# Patient Record
Sex: Female | Born: 1980 | Race: White | Hispanic: No | Marital: Single | Smoking: Never smoker
Health system: Southern US, Community
[De-identification: ages and names within clinical notes are randomized; demographics above are authoritative.]

## PROBLEM LIST (undated history)

## (undated) DIAGNOSIS — I10 Essential (primary) hypertension: Secondary | ICD-10-CM

## (undated) DIAGNOSIS — F259 Schizoaffective disorder, unspecified: Secondary | ICD-10-CM

## (undated) DIAGNOSIS — Z978 Presence of other specified devices: Secondary | ICD-10-CM

## (undated) DIAGNOSIS — Z933 Colostomy status: Secondary | ICD-10-CM

## (undated) DIAGNOSIS — K219 Gastro-esophageal reflux disease without esophagitis: Secondary | ICD-10-CM

## (undated) DIAGNOSIS — T148XXD Other injury of unspecified body region, subsequent encounter: Secondary | ICD-10-CM

## (undated) DIAGNOSIS — G809 Cerebral palsy, unspecified: Secondary | ICD-10-CM

## (undated) DIAGNOSIS — Z87442 Personal history of urinary calculi: Secondary | ICD-10-CM

## (undated) DIAGNOSIS — Z993 Dependence on wheelchair: Secondary | ICD-10-CM

## (undated) HISTORY — PX: COLOSTOMY: SHX63

---

## 2012-07-16 DIAGNOSIS — M419 Scoliosis, unspecified: Secondary | ICD-10-CM | POA: Insufficient documentation

## 2013-05-13 DIAGNOSIS — R339 Retention of urine, unspecified: Secondary | ICD-10-CM | POA: Insufficient documentation

## 2013-08-16 DIAGNOSIS — Z87440 Personal history of urinary (tract) infections: Secondary | ICD-10-CM | POA: Insufficient documentation

## 2013-08-16 DIAGNOSIS — N200 Calculus of kidney: Secondary | ICD-10-CM | POA: Insufficient documentation

## 2013-08-16 DIAGNOSIS — N319 Neuromuscular dysfunction of bladder, unspecified: Secondary | ICD-10-CM | POA: Insufficient documentation

## 2014-05-19 DIAGNOSIS — M5430 Sciatica, unspecified side: Secondary | ICD-10-CM | POA: Insufficient documentation

## 2014-06-24 DIAGNOSIS — G808 Other cerebral palsy: Secondary | ICD-10-CM | POA: Insufficient documentation

## 2015-01-16 DIAGNOSIS — R159 Full incontinence of feces: Secondary | ICD-10-CM | POA: Insufficient documentation

## 2015-01-16 DIAGNOSIS — K5909 Other constipation: Secondary | ICD-10-CM | POA: Insufficient documentation

## 2015-02-13 DIAGNOSIS — L89109 Pressure ulcer of unspecified part of back, unspecified stage: Secondary | ICD-10-CM | POA: Insufficient documentation

## 2015-03-20 DIAGNOSIS — T83511A Infection and inflammatory reaction due to indwelling urethral catheter, initial encounter: Secondary | ICD-10-CM | POA: Insufficient documentation

## 2015-03-20 DIAGNOSIS — N390 Urinary tract infection, site not specified: Secondary | ICD-10-CM | POA: Insufficient documentation

## 2015-04-20 DIAGNOSIS — R112 Nausea with vomiting, unspecified: Secondary | ICD-10-CM | POA: Insufficient documentation

## 2015-04-20 DIAGNOSIS — R1084 Generalized abdominal pain: Secondary | ICD-10-CM | POA: Insufficient documentation

## 2015-06-28 DIAGNOSIS — T839XXA Unspecified complication of genitourinary prosthetic device, implant and graft, initial encounter: Secondary | ICD-10-CM | POA: Insufficient documentation

## 2015-08-08 DIAGNOSIS — G8929 Other chronic pain: Secondary | ICD-10-CM | POA: Insufficient documentation

## 2016-06-09 DIAGNOSIS — T83010A Breakdown (mechanical) of cystostomy catheter, initial encounter: Secondary | ICD-10-CM | POA: Insufficient documentation

## 2016-07-26 DIAGNOSIS — Z792 Long term (current) use of antibiotics: Secondary | ICD-10-CM | POA: Insufficient documentation

## 2016-08-20 DIAGNOSIS — K5900 Constipation, unspecified: Secondary | ICD-10-CM | POA: Insufficient documentation

## 2016-09-24 DIAGNOSIS — E43 Unspecified severe protein-calorie malnutrition: Secondary | ICD-10-CM | POA: Insufficient documentation

## 2017-03-24 DIAGNOSIS — R1032 Left lower quadrant pain: Secondary | ICD-10-CM | POA: Insufficient documentation

## 2017-03-24 DIAGNOSIS — K5641 Fecal impaction: Secondary | ICD-10-CM | POA: Insufficient documentation

## 2017-10-31 DIAGNOSIS — N3001 Acute cystitis with hematuria: Secondary | ICD-10-CM | POA: Insufficient documentation

## 2018-02-13 DIAGNOSIS — N210 Calculus in bladder: Secondary | ICD-10-CM | POA: Insufficient documentation

## 2018-02-13 DIAGNOSIS — R109 Unspecified abdominal pain: Secondary | ICD-10-CM | POA: Insufficient documentation

## 2018-07-03 DIAGNOSIS — K900 Celiac disease: Secondary | ICD-10-CM | POA: Insufficient documentation

## 2018-07-03 DIAGNOSIS — K589 Irritable bowel syndrome without diarrhea: Secondary | ICD-10-CM | POA: Insufficient documentation

## 2018-07-17 DIAGNOSIS — Z0289 Encounter for other administrative examinations: Secondary | ICD-10-CM | POA: Insufficient documentation

## 2018-07-18 DIAGNOSIS — N390 Urinary tract infection, site not specified: Secondary | ICD-10-CM | POA: Insufficient documentation

## 2018-07-18 DIAGNOSIS — A419 Sepsis, unspecified organism: Secondary | ICD-10-CM | POA: Insufficient documentation

## 2019-08-27 ENCOUNTER — Encounter (HOSPITAL_BASED_OUTPATIENT_CLINIC_OR_DEPARTMENT_OTHER): Payer: Medicaid Other | Attending: Internal Medicine | Admitting: Internal Medicine

## 2019-08-27 ENCOUNTER — Other Ambulatory Visit: Payer: Self-pay

## 2019-08-27 DIAGNOSIS — L89153 Pressure ulcer of sacral region, stage 3: Secondary | ICD-10-CM | POA: Diagnosis not present

## 2019-08-27 DIAGNOSIS — G800 Spastic quadriplegic cerebral palsy: Secondary | ICD-10-CM | POA: Insufficient documentation

## 2019-08-27 DIAGNOSIS — I10 Essential (primary) hypertension: Secondary | ICD-10-CM | POA: Diagnosis not present

## 2019-08-27 DIAGNOSIS — L89313 Pressure ulcer of right buttock, stage 3: Secondary | ICD-10-CM | POA: Insufficient documentation

## 2019-08-31 DIAGNOSIS — F4310 Post-traumatic stress disorder, unspecified: Secondary | ICD-10-CM | POA: Insufficient documentation

## 2019-08-31 DIAGNOSIS — R532 Functional quadriplegia: Secondary | ICD-10-CM | POA: Insufficient documentation

## 2019-08-31 DIAGNOSIS — G809 Cerebral palsy, unspecified: Secondary | ICD-10-CM | POA: Insufficient documentation

## 2019-08-31 DIAGNOSIS — L89154 Pressure ulcer of sacral region, stage 4: Secondary | ICD-10-CM | POA: Insufficient documentation

## 2019-08-31 DIAGNOSIS — I10 Essential (primary) hypertension: Secondary | ICD-10-CM | POA: Insufficient documentation

## 2019-08-31 DIAGNOSIS — Z9359 Other cystostomy status: Secondary | ICD-10-CM | POA: Insufficient documentation

## 2019-08-31 DIAGNOSIS — F259 Schizoaffective disorder, unspecified: Secondary | ICD-10-CM | POA: Insufficient documentation

## 2019-09-01 DIAGNOSIS — D509 Iron deficiency anemia, unspecified: Secondary | ICD-10-CM | POA: Insufficient documentation

## 2019-09-10 ENCOUNTER — Encounter (HOSPITAL_BASED_OUTPATIENT_CLINIC_OR_DEPARTMENT_OTHER): Payer: Medicaid Other | Attending: Internal Medicine | Admitting: Internal Medicine

## 2019-09-10 ENCOUNTER — Other Ambulatory Visit: Payer: Self-pay

## 2019-09-10 ENCOUNTER — Encounter (HOSPITAL_BASED_OUTPATIENT_CLINIC_OR_DEPARTMENT_OTHER): Payer: Medicaid Other | Admitting: Internal Medicine

## 2019-09-10 DIAGNOSIS — F259 Schizoaffective disorder, unspecified: Secondary | ICD-10-CM | POA: Diagnosis not present

## 2019-09-10 DIAGNOSIS — F4310 Post-traumatic stress disorder, unspecified: Secondary | ICD-10-CM | POA: Insufficient documentation

## 2019-09-10 DIAGNOSIS — M069 Rheumatoid arthritis, unspecified: Secondary | ICD-10-CM | POA: Insufficient documentation

## 2019-09-10 DIAGNOSIS — L89153 Pressure ulcer of sacral region, stage 3: Secondary | ICD-10-CM | POA: Insufficient documentation

## 2019-09-10 DIAGNOSIS — Z993 Dependence on wheelchair: Secondary | ICD-10-CM | POA: Insufficient documentation

## 2019-09-10 DIAGNOSIS — R532 Functional quadriplegia: Secondary | ICD-10-CM | POA: Insufficient documentation

## 2019-09-10 DIAGNOSIS — L89313 Pressure ulcer of right buttock, stage 3: Secondary | ICD-10-CM | POA: Diagnosis present

## 2019-09-10 DIAGNOSIS — I10 Essential (primary) hypertension: Secondary | ICD-10-CM | POA: Diagnosis not present

## 2019-09-10 DIAGNOSIS — G800 Spastic quadriplegic cerebral palsy: Secondary | ICD-10-CM | POA: Insufficient documentation

## 2019-09-10 DIAGNOSIS — K5090 Crohn's disease, unspecified, without complications: Secondary | ICD-10-CM | POA: Insufficient documentation
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
## 2019-09-23 ENCOUNTER — Emergency Department (HOSPITAL_COMMUNITY)
Admission: EM | Admit: 2019-09-23 | Discharge: 2019-09-23 | Disposition: A | Discharge status: Home / Self Care | Payer: Medicaid Other | Attending: Emergency Medicine | Admitting: Emergency Medicine

## 2019-09-23 ENCOUNTER — Emergency Department (HOSPITAL_COMMUNITY): Payer: Medicaid Other

## 2019-09-23 ENCOUNTER — Encounter (HOSPITAL_COMMUNITY): Payer: Self-pay

## 2019-09-23 ENCOUNTER — Other Ambulatory Visit: Payer: Self-pay

## 2019-09-23 DIAGNOSIS — S7292XA Unspecified fracture of left femur, initial encounter for closed fracture: Secondary | ICD-10-CM | POA: Diagnosis not present

## 2019-09-23 DIAGNOSIS — Y939 Activity, unspecified: Secondary | ICD-10-CM | POA: Insufficient documentation

## 2019-09-23 DIAGNOSIS — Y999 Unspecified external cause status: Secondary | ICD-10-CM | POA: Diagnosis not present

## 2019-09-23 DIAGNOSIS — Y92199 Unspecified place in other specified residential institution as the place of occurrence of the external cause: Secondary | ICD-10-CM | POA: Diagnosis not present

## 2019-09-23 DIAGNOSIS — G8250 Quadriplegia, unspecified: Secondary | ICD-10-CM | POA: Diagnosis not present

## 2019-09-23 DIAGNOSIS — G809 Cerebral palsy, unspecified: Secondary | ICD-10-CM | POA: Insufficient documentation

## 2019-09-23 DIAGNOSIS — W2209XA Striking against other stationary object, initial encounter: Secondary | ICD-10-CM | POA: Insufficient documentation

## 2019-09-23 DIAGNOSIS — I10 Essential (primary) hypertension: Secondary | ICD-10-CM | POA: Insufficient documentation

## 2019-09-23 DIAGNOSIS — S79922A Unspecified injury of left thigh, initial encounter: Secondary | ICD-10-CM | POA: Diagnosis present

## 2019-09-23 HISTORY — DX: Cerebral palsy, unspecified: G80.9

## 2019-09-23 MED ORDER — TRAMADOL HCL 50 MG PO TABS: 50.0000 mg | ORAL_TABLET | Freq: Four times a day (QID) | ORAL | 0 refills | Status: DC | PRN

## 2019-09-23 MED ORDER — TRAMADOL HCL 50 MG PO TABS
50.0000 mg | ORAL_TABLET | Freq: Once | ORAL | Status: AC
  Administered 2019-09-23: 50 mg via ORAL
  Filled 2019-09-23: qty 1

## 2019-09-23 NOTE — Consult Note (Addendum)
I have placed a request via Secure Chat to Dr. Eulis Foster requesting photos of the wound areas of concern to be placed in the  [facility] nurse will assess patient when back on the [place].    [place], [place], Foley

## 2019-09-23 NOTE — ED Notes (Signed)
[facility] called  

## 2019-09-23 NOTE — ED Provider Notes (Signed)
[facility] DEPT Provider Note   CSN: [id number] Arrival date & time: 09/23/19  1155     History   Chief Complaint Chief Complaint  Patient presents with  . Left Leg Pain    HPI Fuller Fuller is a 38 y.o. female.     38yo female with history of CP and quadriplegia brought in by EMS from group home for left knee pain.  Patient states that she has been taking Tylenol for her knee pain but has run out of tramadol.  Patient states that she hit her knee on the counter 2 weeks ago.  No other injuries, complaints, concerns.     Past Medical History:  Diagnosis Date  . Cerebral palsy [facility])     Patient Active Problem List   Diagnosis Date Noted  . Iron deficiency anemia 09/01/2019  . Benign essential hypertension 08/31/2019  . Cerebral palsy ([place]) 08/31/2019  . Chronic suprapubic catheter ([place]) 08/31/2019  . Functional quadriplegia ([place]) 08/31/2019  . PTSD (post-traumatic stress disorder) 08/31/2019  . Sacral decubitus ulcer, stage IV ([place]) 08/31/2019  . Schizoaffective disorder ([place]) 08/31/2019    History reviewed. No pertinent surgical history.   OB History   No obstetric history on file.      Home Medications    Prior to Admission medications   Medication Sig Start Date End Date Taking? Authorizing Provider  traMADol (ULTRAM) 50 MG tablet Take 1 tablet (50 mg total) by mouth every 6 (six) hours as needed. 09/23/19   Tacy Learn, PA-C    Family History History reviewed. No pertinent family history.  Social History Social History   Tobacco Use  . Smoking status: Not on file  Substance Use Topics  . Alcohol use: Not on file  . Drug use: Not on file     Allergies   Levaquin [levofloxacin], Codeine, Docusate, Quinolones, and Sulfa antibiotics   Review of Systems Review of Systems  Constitutional: Negative for fever.  Musculoskeletal: Positive for arthralgias and joint swelling.  Skin: Negative for rash and wound.   Neurological: Negative for numbness.  Hematological: Does not bruise/bleed easily.  All other systems reviewed and are negative.    Physical Exam Updated Vital Signs BP 114/73 (BP Location: Left Arm)   Pulse 73   Temp 98.7 F (37.1 C) (Oral)   Resp 18   SpO2 100%   Physical Exam Vitals signs and nursing note reviewed.  Constitutional:      General: She is not in acute distress.    Appearance: She is well-developed. She is not diaphoretic.  HENT:     Head: Normocephalic and atraumatic.  Cardiovascular:     Pulses: Normal pulses.  Pulmonary:     Effort: Pulmonary effort is normal.  Musculoskeletal:        General: Swelling and tenderness present.     Left knee: She exhibits swelling. Tenderness found.       Legs:     Comments: Swelling to left knee compared to right with minor bruise to anterior left knee, minor abrasion without signs of secondary infection.  Skin:    General: Skin is warm and dry.  Neurological:     Mental Status: She is alert and oriented to person, place, and time.  Psychiatric:        Behavior: Behavior normal.      ED Treatments / Results  Labs (all labs ordered are listed, but only abnormal results are displayed) Labs Reviewed - No data to display  EKG None  Radiology Dg Knee Complete 4 Views Left  Result Date: 09/23/2019 CLINICAL DATA:  38 year old female with left lower extremity pain. EXAM: LEFT KNEE - COMPLETE 4+ VIEW COMPARISON:  None. FINDINGS: Evaluation is limited due to osteoporosis. There is minimally displaced and angulated fracture of the distal femoral metadiaphysis. No dislocation. There is narrowing of the medial and lateral compartments. No large effusion. The soft tissues are unremarkable. IMPRESSION: 1. Mildly displaced and angulated fracture of the distal femoral metadiaphysis. No dislocation. 2. Osteoporosis. Electronically Signed   By: Anner Crete M.D.   On: 09/23/2019 12:57    Procedures Procedures (including  critical care time)  SPLINT APPLICATION Date/Time: 8:92 PM Authorized by: Tacy Learn Consent: Verbal consent obtained. Risks and benefits: risks, benefits and alternatives were discussed Consent given by: patient Splint applied by: orthopedic technician Location details: left leg Splint type: posterior OCL Supplies used: OCL, ace Post-procedure: The splinted body part was neurovascularly unchanged following the procedure. Patient tolerance: Patient tolerated the procedure well with no immediate complications. Splinted in position of comfort.    Medications Ordered in ED Medications  traMADol (ULTRAM) tablet 50 mg (50 mg Oral Given 09/23/19 1424)     Initial Impression / Assessment and Plan / ED Course  I have reviewed the triage vital signs and the nursing notes.  Pertinent labs & imaging results that were available during my care of the patient were reviewed by me and considered in my medical decision making (see chart for details).  Clinical Course as of Sep 22 1516  Thu Sep 22, 7048  59 38 year old female presents from group home for left knee pain.  Patient has cerebral palsy and is a quadriplegic, states that she hit her knee on the counter about 2 weeks ago, has been taking Ultram for her pain at home with relief however has run out of Ultram.  On exam patient has swelling and tenderness to her left knee, small area of ecchymosis anteriorly with a small abrasion. X-ray shows a mildly displaced and angulated fracture of the distal femoral metadiaphysis.  Case was discussed with orthopedics, patient is not ambulatory, will be splinted with a posterior OCL in position of comfort with plan to follow-up with orthopedics.  Patient's tramadol was refilled.  Patient has an indwelling suprapubic catheter, states it has been in place for about a month, she has had difficulty establishing care with home health and request to have her catheter exchanged today. Case management was  consulted and is working with patient's aide to straighten out her home health situation.  Foley catheter was exchanged by myself without difficulty. Patient also request information for local dentist as she is new to the area information given.   [LM]    Clinical Course User Index [LM] Tacy Learn, PA-C      Final Clinical Impressions(s) / ED Diagnoses   Final diagnoses:  Closed fracture of left femur, unspecified fracture morphology, unspecified portion of femur, initial encounter [facility])    ED Discharge Orders         Ordered    traMADol (ULTRAM) 50 MG tablet  Every 6 hours PRN     09/23/19 1502           Tacy Learn, PA-C 09/23/19 1517    Daleen Bo, MD 09/24/19 1316

## 2019-09-23 NOTE — ED Notes (Signed)
ED Provider at bedside. 

## 2019-09-23 NOTE — ED Provider Notes (Signed)
  Face-to-face evaluation   History: She injured her left knee, 2 weeks ago and has been having persistent pain, and trouble moving around because of it.  She has functional quadriplegia from cerebral palsy.  She manages her own affairs.  She lives at her home with a caregiver that is there almost all the time.  She sometimes gets to a wheelchair but in the last several weeks, prior to the injury, she has been having trouble getting up with [facility] lift.  She was taking tramadol for pain but ran out of it, now her pain of her left knee, that was in her 2 weeks ago is intolerable.  Patient states that she manages her own affairs.  Physical exam: Debilitated appearing alert and cooperative.  Left knee is tender and swollen, as compared to right.  She cannot lift either leg off the stretcher.  No other visible signs of injury.  Patient is lucid.  Medical screening examination/treatment/procedure(s) were conducted as a shared visit with non-physician practitioner(s) and myself.  I personally evaluated the patient during the encounter    Daleen Bo, MD 09/24/19 1317

## 2019-09-23 NOTE — Discharge Instructions (Signed)
Follow-up with orthopedics, call to schedule an appointment. Take Ultram as needed as prescribed for pain. Dental information given for area dentist.

## 2019-09-23 NOTE — TOC Initial Note (Addendum)
Transition of Care [facility]) - Initial/Assessment Note    Patient Details  Name: Pamela Fuller MRN: [id number] Date of Birth: 12-Nov-1980  Transition of Care [facility]) CM/SW Contact:    Pamela Rasher, RN Phone Number: [phone] 09/23/2019, 4:25 PM  Clinical Narrative:                 Spoke to pt and caregiver, Pamela Fuller. Pt currently lives her group home. States when she was dc from [facility] a month ago they were referred for [facility] but no accepted referral. Offered choice. States she will accept agency that accept referral. States she has an appt at [facility] tomorrow. States she will doing the dressing changes but has not been shown how to do the dressing on sacral area. [place], [facility]. They are unable to accept. Contacted [facility] with new referral will give call back.   PCP [name] [phone] Has appt with Pain Management on 10/04/2019.  Pt will have CAP/DA with aide coming 30 hours per week to start next week.   Expected Discharge Plan: [place] Barriers to Discharge: No Barriers Identified   Patient Goals and CMS Choice Patient states their goals for this hospitalization and ongoing recovery are:: needs supplies to assist with wound CMS Medicare.gov Compare Post Acute Care list provided to:: Patient Represenative (must comment)(Pamela Fuller) Choice offered to / list presented to : [facility] POA / Guardian  Expected Discharge Plan and Services Expected Discharge Plan: [place]   Discharge Planning Services: CM Consult Post Acute Care Choice: Will arrangements for the past 2 months: [place]: RN          Prior Living Arrangements/Services Living arrangements for the past 2 months: Single Family Home Lives with:: Other (Comment)(Group Home) Patient language and need for interpreter reviewed:: Yes Do you feel  safe going back to the place where you live?: Yes      Need for Family Participation in Patient Care: Yes (Comment) Care giver support system in place?: Yes (comment) Current home services: DME(electric wheelchair, handicap Lucianne Lei, hospital bed. air mattress, hoyer lift) Criminal Activity/Legal Involvement Pertinent to Current Situation/Hospitalization: No - Comment as needed  Activities of Daily Living      Permission Sought/Granted Permission sought to share information with : Case Manager, PCP, Family Supports Permission granted to share information with : Yes, Verbal Permission Granted  Share Information with NAME: Pamela Fuller  Permission granted to share info w AGENCY: [place] granted to share info w Relationship: Group Home Caregiver  Permission granted to share info w Contact Information: [phone]  Emotional Assessment Appearance:: Appears stated age Attitude/Demeanor/Rapport: Engaged Affect (typically observed): Accepting Orientation: : Oriented to Self, Oriented to Place, Oriented to  Time, Oriented to Situation Alcohol / Substance Use: Never Used Psych Involvement: No (comment)  Admission diagnosis:  left leg pain Patient Active Problem List   Diagnosis Date Noted  . Iron deficiency anemia 09/01/2019  . Benign essential hypertension 08/31/2019  . Cerebral palsy ([place]) 08/31/2019  . Chronic suprapubic catheter ([place]) 08/31/2019  . 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 PTSD (post-traumatic stress disorder) 08/31/2019  . Sacral decubitus ulcer, stage IV ([place]) 08/31/2019  . Schizoaffective disorder ([place]) 08/31/2019   PCP:  Patient, No Pcp Per Pharmacy:   [id number] [address], [address] AT [facility] [address] [place] [address] Phone: [phone] Fax: [phone]     Social Determinants of Health ([place]) Interventions    Readmission Risk Interventions No flowsheet  data found.

## 2019-09-23 NOTE — ED Notes (Signed)
PTAR bedside

## 2019-09-23 NOTE — ED Triage Notes (Signed)
Pt BIB EMS from home. Pt c/o left leg pain and left hip pain. Pt takes home pain medications that she c/o running out of. Pt hit left leg on counter 2 weeks ago from her wheelchair, pt wheelchair bound. Pt has permanent suprapubic catheter.   120/80 80 HR 96% RA 98.9 temp

## 2019-09-24 ENCOUNTER — Ambulatory Visit (HOSPITAL_BASED_OUTPATIENT_CLINIC_OR_DEPARTMENT_OTHER): Payer: Medicaid Other | Admitting: Internal Medicine

## 2019-10-03 ENCOUNTER — Encounter (HOSPITAL_COMMUNITY): Payer: Self-pay

## 2019-10-03 ENCOUNTER — Emergency Department (HOSPITAL_COMMUNITY)
Admission: EM | Admit: 2019-10-03 | Discharge: 2019-10-04 | Disposition: A | Discharge status: Home / Self Care | Payer: Medicaid Other | Attending: Emergency Medicine | Admitting: Emergency Medicine

## 2019-10-03 ENCOUNTER — Other Ambulatory Visit: Payer: Self-pay

## 2019-10-03 ENCOUNTER — Emergency Department (HOSPITAL_COMMUNITY): Payer: Medicaid Other

## 2019-10-03 DIAGNOSIS — T83010A Breakdown (mechanical) of cystostomy catheter, initial encounter: Secondary | ICD-10-CM

## 2019-10-03 DIAGNOSIS — Z436 Encounter for attention to other artificial openings of urinary tract: Secondary | ICD-10-CM | POA: Diagnosis present

## 2019-10-03 DIAGNOSIS — G800 Spastic quadriplegic cerebral palsy: Secondary | ICD-10-CM | POA: Insufficient documentation

## 2019-10-03 DIAGNOSIS — R1084 Generalized abdominal pain: Secondary | ICD-10-CM | POA: Insufficient documentation

## 2019-10-03 LAB — COMPREHENSIVE METABOLIC PANEL
ALT: 11 U/L (ref 0–44)
AST: 10 U/L — ABNORMAL LOW (ref 15–41)
Albumin: 3.8 g/dL (ref 3.5–5.0)
Alkaline Phosphatase: 132 U/L — ABNORMAL HIGH (ref 38–126)
Anion gap: 9 (ref 5–15)
BUN: 20 mg/dL (ref 6–20)
CO2: 22 mmol/L (ref 22–32)
Calcium: 8.6 mg/dL — ABNORMAL LOW (ref 8.9–10.3)
Chloride: 106 mmol/L (ref 98–111)
Creatinine, Ser: <0.3 mg/dL — ABNORMAL LOW (ref 0.44–1.00)
Glucose, Bld: 94 mg/dL (ref 70–99)
Potassium: 3.6 mmol/L (ref 3.5–5.1)
Sodium: 137 mmol/L (ref 135–145)
Total Bilirubin: 0.6 mg/dL (ref 0.3–1.2)
Total Protein: 8.1 g/dL (ref 6.5–8.1)

## 2019-10-03 LAB — CBC WITH DIFFERENTIAL/PLATELET
Abs Immature Granulocytes: 0.04 10*3/uL (ref 0.00–0.07)
Basophils Absolute: 0.1 10*3/uL (ref 0.0–0.1)
Basophils Relative: 1 %
Eosinophils Absolute: 0.5 10*3/uL (ref 0.0–0.5)
Eosinophils Relative: 4 %
HCT: 39.5 % (ref 36.0–46.0)
Hemoglobin: 11.9 g/dL — ABNORMAL LOW (ref 12.0–15.0)
Immature Granulocytes: 0 %
Lymphocytes Relative: 19 %
Lymphs Abs: 2 10*3/uL (ref 0.7–4.0)
MCH: 26.1 pg (ref 26.0–34.0)
MCHC: 30.1 g/dL (ref 30.0–36.0)
MCV: 86.6 fL (ref 80.0–100.0)
Monocytes Absolute: 0.6 10*3/uL (ref 0.1–1.0)
Monocytes Relative: 6 %
Neutro Abs: 7.7 10*3/uL (ref 1.7–7.7)
Neutrophils Relative %: 70 %
Platelets: 307 10*3/uL (ref 150–400)
RBC: 4.56 MIL/uL (ref 3.87–5.11)
RDW: 24.9 % — ABNORMAL HIGH (ref 11.5–15.5)
WBC: 10.9 10*3/uL — ABNORMAL HIGH (ref 4.0–10.5)
nRBC: 0 % (ref 0.0–0.2)

## 2019-10-03 LAB — LIPASE, BLOOD: Lipase: 24 U/L (ref 11–51)

## 2019-10-03 LAB — I-STAT BETA HCG BLOOD, ED (MC, WL, AP ONLY): I-stat hCG, quantitative: <5 m[IU]/mL (ref <5)

## 2019-10-03 LAB — LACTIC ACID, PLASMA: Lactic Acid, Venous: 0.8 mmol/L (ref 0.5–1.9)

## 2019-10-03 MED ORDER — ONDANSETRON HCL 4 MG/2ML IJ SOLN
4.0000 mg | Freq: Once | INTRAMUSCULAR | Status: AC
  Administered 2019-10-03: 4 mg via INTRAVENOUS
  Filled 2019-10-03: qty 2

## 2019-10-03 MED ORDER — BACLOFEN 10 MG PO TABS: 15.0000 mg | ORAL_TABLET | Freq: Three times a day (TID) | ORAL | 0 refills | Status: AC

## 2019-10-03 MED ORDER — FENTANYL CITRATE (PF) 100 MCG/2ML IJ SOLN
25.0000 ug | Freq: Once | INTRAMUSCULAR | Status: AC
  Administered 2019-10-04: 25 ug via INTRAVENOUS
  Filled 2019-10-03: qty 2

## 2019-10-03 MED ORDER — IOHEXOL 300 MG/ML  SOLN
100.0000 mL | Freq: Once | INTRAMUSCULAR | Status: AC | PRN
  Administered 2019-10-03: 100 mL via INTRAVENOUS

## 2019-10-03 MED ORDER — SODIUM CHLORIDE 0.9 % IV BOLUS
1000.0000 mL | Freq: Once | INTRAVENOUS | Status: AC
  Administered 2019-10-03: 1000 mL via INTRAVENOUS

## 2019-10-03 MED ORDER — TRAMADOL HCL 50 MG PO TABS: 50.0000 mg | ORAL_TABLET | Freq: Three times a day (TID) | ORAL | 0 refills | Status: DC | PRN

## 2019-10-03 MED ORDER — FENTANYL CITRATE (PF) 100 MCG/2ML IJ SOLN
25.0000 ug | Freq: Once | INTRAMUSCULAR | Status: AC
  Administered 2019-10-03: 25 ug via INTRAVENOUS
  Filled 2019-10-03: qty 2

## 2019-10-03 NOTE — ED Provider Notes (Signed)
[facility] DEPT Provider Note   CSN: [id number] Arrival date & time: 10/03/19  6295    History   Chief Complaint Chief Complaint  Patient presents with  . Urinary Catheter Leak  . Abdominal Pain    lower abdominal pain    HPI Pamela Fuller is a 38 y.o. female with past medical history significant for cerebral palsy, chronic catheter, its affective disorder, sacral decubitus ulcer, anemia who presents for evaluation of abdominal pain.  Patient states she has had abdominal pain x3 days.  Pain located diffusely to her abdomen.  Patient states she is also had difficulty with leaking around her catheter site.  With her current pain a 9/10.  She denies fever, nausea, vomiting, chest pain, shortness of breath.  She admits to normal stools melena or bright red blood.  Denies additional aggravating or alleviating factors.  She has had some chills.  She is not taking anything for pain.  Denies any vaginal bleeding, vaginal discharge or pelvic pain.  She is typically wheelchair-bound however did have a femur fracture approximately 4 weeks ago and has been bedbound since.  No prior history of PE or DVT.  History obtained from patient and past medical records.  No interpreter is used.  Patient admitted to [facility] from 04/25/2019 through 08/24/2019.  Is felt to be septic from UTI.  According to records she has history of Crohn's disease, RA.     HPI  Past Medical History:  Diagnosis Date  . Cerebral palsy [facility])     Patient Active Problem List   Diagnosis Date Noted  . Iron deficiency anemia 09/01/2019  . Benign essential hypertension 08/31/2019  . Cerebral palsy ([place]) 08/31/2019  . Chronic suprapubic catheter ([place]) 08/31/2019  . 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 PTSD (post-traumatic stress disorder) 08/31/2019  . Sacral decubitus ulcer, stage IV ([place]) 08/31/2019  . Schizoaffective disorder ([place]) 08/31/2019    No past surgical history on  file.   OB History   No obstetric history on file.      Home Medications    Prior to Admission medications   Medication Sig Start Date End Date Taking? Authorizing Provider  traMADol (ULTRAM) 50 MG tablet Take 1 tablet (50 mg total) by mouth every 6 (six) hours as needed. 09/23/19   Tacy Learn, PA-C    Family History No family history on file.  Social History Social History   Tobacco Use  . Smoking status: Not on file  Substance Use Topics  . Alcohol use: Not on file  . Drug use: Not on file     Allergies   Levaquin [levofloxacin], Codeine, Docusate, Quinolones, and Sulfa antibiotics   Review of Systems Review of Systems  Constitutional: Positive for chills. Negative for activity change, appetite change, diaphoresis, fatigue and fever.  HENT: Negative.   Respiratory: Negative.   Cardiovascular: Negative.   Gastrointestinal: Positive for abdominal pain. Negative for nausea and vomiting.  Genitourinary: Negative.        Leakage around urinary catheter.  Musculoskeletal: Negative.   Skin: Negative.   Neurological: Negative.   All other systems reviewed and are negative.    Physical Exam Updated Vital Signs BP 118/77 (BP Location: Right Arm)   Pulse 73   Temp 98.2 F (36.8 C) (Oral)   Resp 17   SpO2 98% Comment: Simultaneous filing. User may not have seen previous data.  Physical Exam Vitals signs and nursing note reviewed.  Constitutional:      General:  She is not in acute distress.    Appearance: She is well-developed. She is not toxic-appearing.     Comments: Patient in hallway bed unable to undress patient for full assessment.  HENT:     Head: Atraumatic.  Eyes:     Pupils: Pupils are equal, round, and reactive to light.  Neck:     Musculoskeletal: Normal range of motion.  Cardiovascular:     Rate and Rhythm: Normal rate.     Pulses: Normal pulses.          Dorsalis pedis pulses are 2+ on the right side and 2+ on the left side.        Posterior tibial pulses are 2+ on the right side and 2+ on the left side.     Heart sounds: Normal heart sounds.  Pulmonary:     Effort: Pulmonary effort is normal. No respiratory distress.     Breath sounds: Normal breath sounds and air entry.     Comments: Clear to auscultation bilateral without wheeze, rhonchi or rales.  Speaks in full sentences without difficulty. Abdominal:     General: There is no distension.     Palpations: Abdomen is soft.     Tenderness: There is generalized abdominal tenderness. There is no right CVA tenderness, left CVA tenderness, guarding or rebound. Negative signs include Murphy's sign and McBurney's sign.     Hernia: No hernia is present.     Comments: Soft, generalized tenderness palpation.  Diverting ostomy to lower abdomen brown stool in bag. Indwelling suprapubic cath draining cloudy urine.  There is some purulent drainage around the suprapubic catheter site however no fluctuance, induration or erythema..  Musculoskeletal: Normal range of motion.     Comments: Extremities mildly contracted however able to move toes.  Brace to left femur intact.  Stage IV pressure ulcer to sacral area.  No drainage.  Skin:    General: Skin is warm and dry.     Comments: Unable to assess for skin lesions, sacral ulcer due to patient's location.  Nursing aware patient needs to be moved to room for a full exam.  Neurological:     Mental Status: She is alert.    ED Treatments / Results  Labs (all labs ordered are listed, but only abnormal results are displayed) Labs Reviewed  URINE CULTURE  CULTURE, BLOOD (ROUTINE X 2)  CULTURE, BLOOD (ROUTINE X 2)  CBC WITH DIFFERENTIAL/PLATELET  COMPREHENSIVE METABOLIC PANEL  LIPASE, BLOOD  URINALYSIS, ROUTINE W REFLEX MICROSCOPIC  LACTIC ACID, PLASMA  LACTIC ACID, PLASMA  I-STAT BETA HCG BLOOD, ED ([facility], [facility], [facility] ONLY)    EKG None  Radiology No results found.  Procedures Procedures (including critical care time)  Medications  Ordered in ED Medications  sodium chloride 0.9 % bolus 1,000 mL (1,000 mLs Intravenous New Bag/Given 10/03/19 2107)  ondansetron (ZOFRAN) injection 4 mg (4 mg Intravenous Given 10/03/19 2107)  fentaNYL (SUBLIMAZE) injection 25 mcg (25 mcg Intravenous Given 10/03/19 2107)   Initial Impression / Assessment and Plan / ED Course  I have reviewed the triage vital signs and the nursing notes.  Pertinent labs & imaging results that were available during my care of the patient were reviewed by me and considered in my medical decision making (see chart for details).  38 year old female presents for evaluation of generalized abdominal pain, nonflushing indwelling catheter as well as chills.  She is afebrile here nonseptic appearing.  Already p.o. intake at home.  She has generalized tenderness palpation.  Unfortunately I am not able to do a full skin exam of patient initial evaluation or assess her Foley catheter due to patient's location in hallway bed.  Nursing was notified that patient needed in room for further assessment.  Heart and lungs clear.  I have placed labs.  She will likely need CT scan abdomen as well as bladder scan and Foley replacement.   On exam patient has some purulent drainage to her suprapubic catheter.  No surrounding erythema, warmth, joints, induration.  She also has a stage IV pressure ulcer to her sacral region with some surrounding erythema.  No drainage.  Care transferred to [facility] [place] who will follow up on CT, Urinalysis, Labs. Disposition per Candice Camp.      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

## 2019-10-03 NOTE — ED Provider Notes (Signed)
Care assumed at shift change from [facility], PA-C, pending labs and CT scan. See her note for full HPI and workup. Briefly, pt presenting with hx spastic quad and cerebral palsy. Chronic suprapubic cath and ostomy bag. Presenting today with gen abd pain x3 days.  Also noted to have drainage around cath site, no surrounding redness, induration or tenderness. Urine is cloudy.  Of note, patient has chronicstage 4 sacral ulcer, pain is unchanged from baseline and no concerns regarding this today.  She also had recent left femur fracture on 09/23/2019 fractured left femur, which is splinted, as patient is nonambulatory.  Plan to follow-up CT scan and lab work.  CT scan ordered with history of IBS in mind, however abdominal exam is without peritoneal signs. Physical Exam  BP 118/77 (BP Location: Right Arm)   Pulse 73   Temp 98.2 F (36.8 C) (Oral)   Resp 17   SpO2 98% Comment: Simultaneous filing. User may not have seen previous data.  Physical Exam Vitals signs and nursing note reviewed.  Constitutional:      General: She is not in acute distress.    Appearance: She is well-developed.  HENT:     Head: Normocephalic and atraumatic.  Eyes:     Conjunctiva/sclera: Conjunctivae normal.  Cardiovascular:     Rate and Rhythm: Normal rate.  Pulmonary:     Effort: Pulmonary effort is normal.  Abdominal:     General: Bowel sounds are normal.     Palpations: Abdomen is soft.     Tenderness: There is no abdominal tenderness.     Comments: Suprapubic catheter in place, small amount of drainage noted at the site.  No surrounding inflammation. Ostomy with brown stool and moderate sized prolapse.  Skin:    General: Skin is warm.  Neurological:     Mental Status: She is alert.  Psychiatric:        Behavior: Behavior normal.     ED Course/Procedures     Procedures Results for orders placed or performed during the hospital encounter of 10/03/19  CBC with Differential  Result Value Ref Range   WBC  10.9 (H) 4.0 - 10.5 K/uL   RBC 4.56 3.87 - 5.11 MIL/uL   Hemoglobin 11.9 (L) 12.0 - 15.0 g/dL   HCT 39.5 36.0 - 46.0 %   MCV 86.6 80.0 - 100.0 fL   MCH 26.1 26.0 - 34.0 pg   MCHC 30.1 30.0 - 36.0 g/dL   RDW 24.9 (H) 11.5 - 15.5 %   Platelets 307 150 - 400 K/uL   nRBC 0.0 0.0 - 0.2 %   Neutrophils Relative % 70 %   Neutro Abs 7.7 1.7 - 7.7 K/uL   Lymphocytes Relative 19 %   Lymphs Abs 2.0 0.7 - 4.0 K/uL   Monocytes Relative 6 %   Monocytes Absolute 0.6 0.1 - 1.0 K/uL   Eosinophils Relative 4 %   Eosinophils Absolute 0.5 0.0 - 0.5 K/uL   Basophils Relative 1 %   Basophils Absolute 0.1 0.0 - 0.1 K/uL   Smear Review MORPHOLOGY UNREMARKABLE    Immature Granulocytes 0 %   Abs Immature Granulocytes 0.04 0.00 - 0.07 K/uL  Comprehensive metabolic panel  Result Value Ref Range   Sodium 137 135 - 145 mmol/L   Potassium 3.6 3.5 - 5.1 mmol/L   Chloride 106 98 - 111 mmol/L   CO2 22 22 - 32 mmol/L   Glucose, Bld 94 70 - 99 mg/dL   BUN 20 6 -  20 mg/dL   Creatinine, Ser <0.30 (L) 0.44 - 1.00 mg/dL   Calcium 8.6 (L) 8.9 - 10.3 mg/dL   Total Protein 8.1 6.5 - 8.1 g/dL   Albumin 3.8 3.5 - 5.0 g/dL   AST 10 (L) 15 - 41 U/L   ALT 11 0 - 44 U/L   Alkaline Phosphatase 132 (H) 38 - 126 U/L   Total Bilirubin 0.6 0.3 - 1.2 mg/dL   GFR calc non Af Amer NOT CALCULATED >60 mL/min   GFR calc Af Amer NOT CALCULATED >60 mL/min   Anion gap 9 5 - 15  Lipase, blood  Result Value Ref Range   Lipase 24 11 - 51 U/L  Lactic acid, plasma  Result Value Ref Range   Lactic Acid, Venous 0.8 0.5 - 1.9 mmol/L  I-Stat Beta hCG blood, ED ([facility], [facility], [facility] only)  Result Value Ref Range   I-stat hCG, quantitative <5.0 <5 mIU/mL   Comment 3           Ct Abdomen Pelvis W Contrast  Result Date: 10/03/2019 CLINICAL DATA:  Acute abdominal pain EXAM: CT ABDOMEN AND PELVIS WITH CONTRAST TECHNIQUE: Multidetector CT imaging of the abdomen and pelvis was performed using the standard protocol following bolus administration of  intravenous contrast. CONTRAST:  161m OMNIPAQUE IOHEXOL 300 MG/ML  SOLN COMPARISON:  August 31, 2019 FINDINGS: Lower chest: The visualized heart size within normal limits. No pericardial fluid/thickening. No hiatal hernia. The visualized portions of the lungs are clear. Hepatobiliary: The liver is normal in density without focal abnormality.The main portal vein is patent. No evidence of calcified gallstones, gallbladder wall thickening or biliary dilatation. Pancreas: Unremarkable. No pancreatic ductal dilatation or surrounding inflammatory changes. Spleen: Normal in size without focal abnormality. Adrenals/Urinary Tract: Both adrenal glands appear normal. The kidneys and collecting system appear normal without evidence of urinary tract calculus or hydronephrosis. A suprapubic catheter is again noted. There is diffuse bladder wall thickening and posterior bladder calculi again seen. Stomach/Bowel: The stomach, small bowel, are normal in appearance. Again noted is a left lower quadrant colostomy site. No areas of significant fat stranding changes or bowel wall thickening. There is under distention of the sigmoid rectal junction as on the prior exam. Scattered colonic diverticula are noted. Vascular/Lymphatic: There are no enlarged mesenteric, retroperitoneal, or pelvic lymph nodes. No significant vascular findings are present. Reproductive: Lobular heterogeneous intrauterine fibroids are seen. A small amount of fluid is seen within the endometrial canal. Other: No hernia is noted. Musculoskeletal: Unchanged sacral decubitus ulcer with findings of cortical destruction of probable chronic osteomyelitis involving sacral coccygeal element. Posterior spinal fixation hardware seen throughout the thoracolumbar spine. IMPRESSION: 1. Diffuse bladder wall thickening, likely due to chronic changes. No significant surrounding fat stranding changes. 2. Bladder calculi 3. Left lower quadrant colostomy without evidence of bowel  obstruction. 4. Sacral decubitus ulcer with findings of chronic osteomyelitis involving the sacrococcygeal element Electronically Signed   By: BPrudencio PairM.D.   On: 10/03/2019 23:48    MDM  Care assumed at shift change pending labs and CT scan.  Labs are very reassuring and vital signs have remained stable.  CT scan without acute pathology.  UA is pending at this time, to be followed by oncoming provider PA McDonald.  Patient symptoms improved with interventions, her main complaint on my evaluation is her left leg pain after recent femur fracture.  She states her pain was significantly improved with the prescribed tramadol, however she has run out of the  medications.  She also states she was unable to find the referral to the orthopedic specialist and has not been able to do so.  She states she is fairly new to the area and is still establishing her primary providers.  She is requesting a gynecology referral as she has been unsuccessful in finding a provider to accept Medicaid.  She is also requesting a refill of baclofen which she takes chronically for her muscle spasms.  Refer to women's outpatient clinic is provided in discharge paperwork as well as contact information for initial orthopedic referral per ED visit she was diagnosed with femur fracture.  Also sent in refill for tramadol and baclofen.   Plan to treat UTI if present, otherwise discharge with symptomatic management.      Ketsia Linebaugh, [place] N, PA-C 10/04/19 0012    Lucrezia Starch, MD 10/04/19 1154

## 2019-10-03 NOTE — ED Triage Notes (Signed)
Arrived by [facility] from home; live with caregiver. Patient reports leak in indwelling catheter where it connects to bag and lower abdominal pain. EMS reports patient was septic in June and hospitalized from June 2020 to October 2020

## 2019-10-03 NOTE — Discharge Instructions (Addendum)
Take 1 tablet of Augmentin by mouth 2 times daily for the next 14 days.  This has been called into your pharmacy along with tramadol and baclofen.  You can call [facility] first thing tomorrow morning to schedule a time to have your suprapubic catheter exchanged.  I have also consulted interventional radiology.  They may give you a hot call at home.  They can also do a suprapubic catheter exchange.  ORTHOPEDIC SPECIALIST to follow up on your leg: Erle Crocker, MD [place]  [id number] [phone]   Gynecology: [facility] [place] [phone]

## 2019-10-03 NOTE — ED Provider Notes (Signed)
38 year old female received at signout from [facility] pending urinalysis results.  Per her HPI:  "Pamela Fuller is a 38 y.o. female with past medical history significant for cerebral palsy, chronic catheter, its affective disorder, sacral decubitus ulcer, anemia who presents for evaluation of abdominal pain.  Patient states she has had abdominal pain x3 days.  Pain located diffusely to her abdomen.  Patient states she is also had difficulty with leaking around her catheter site.  With her current pain a 9/10.  She denies fever, nausea, vomiting, chest pain, shortness of breath.  She admits to normal stools melena or bright red blood.  Denies additional aggravating or alleviating factors.  She has had some chills.  She is not taking anything for pain.  Denies any vaginal bleeding, vaginal discharge or pelvic pain.  She is typically wheelchair-bound however did have a femur fracture approximately 4 weeks ago and has been bedbound since.  No prior history of PE or DVT.  History obtained from patient and past medical records.  No interpreter is used.  Patient admitted to [facility] from 04/25/2019 through 08/24/2019.  Is felt to be septic from UTI.  According to records she has history of Crohn's disease, RA."  Physical Exam  BP 122/84   Pulse 78   Temp 98.2 F (36.8 C) (Oral)   Resp 17   SpO2 96%   Physical Exam Vitals signs and nursing note reviewed.  Constitutional:      Appearance: She is well-developed.  HENT:     Head: Normocephalic and atraumatic.  Neck:     Musculoskeletal: Normal range of motion.  Abdominal:     Comments: Suprapubic catheter is in place.  There is leaking around the site of the catheter.  Musculoskeletal: Normal range of motion.     Comments: Ace wrap in place over the left thigh  Skin:    Comments: Stage IV sacral ulcer.  Neurological:     Mental Status: She is alert and oriented to person, place, and time.     ED Course/Procedures      Procedures  MDM   38 year old female received at signout from [facility] who was initially seen by PA Henderly pending urinalysis results.  Please see their notes for further work-up and medical decision making.  Urinalysis concerning for infection.  The patient has allergies to fluoroquinolones and sulfa antibiotics.  No previous urine cultures are available so we will discharge home with Augmentin.  The patient's primary complaint for presenting to the ER today was a leaking suprapubic catheter.  There was concern about abdominal tenderness and a CT abdomen pelvis was performed, which did not show any acute changes.  It did redemonstrate chronic osteomyelitis, but no evidence of acute changes.    Unfortunately, the patient's primary concern of her suprapubic catheter leaking was not exchanged prior to my evaluation of the patient in the middle of the night.  Discussed with the patient that urology and IR are not available for emergent suprapubic catheter placement overnight in the ER.  Her catheter is draining, but there is noted to be some leaking around the site on exam.  Will follow up with IR and refer the patient to urology for suprapubic catheter exchange.  PA Quentin Cornwall has also called in a refill of tramadol given the patient's femur fracture and has we refer the patient to orthopedics for follow-up.  She also provided her with an OB/GYN referral.   All questions answered.  ER return precautions given.  She is hemodynamically stable and in no acute distress.  Safe for discharge home with outpatient follow-up as indicated.     Joanne Gavel, PA-C 10/04/19 7921    Palumbo, April, MD 10/06/19 2353

## 2019-10-04 LAB — URINALYSIS, ROUTINE W REFLEX MICROSCOPIC
Bilirubin Urine: NEGATIVE
Glucose, UA: NEGATIVE mg/dL
Ketones, ur: NEGATIVE mg/dL
Nitrite: POSITIVE — AB
Protein, ur: >=300 mg/dL — AB
Specific Gravity, Urine: 1.018 (ref 1.005–1.030)
pH: 8 (ref 5.0–8.0)

## 2019-10-04 MED ORDER — CEFTRIAXONE SODIUM 1 G IJ SOLR
1.0000 g | Freq: Once | INTRAMUSCULAR | Status: AC
  Administered 2019-10-04: 1 g via INTRAMUSCULAR
  Filled 2019-10-04: qty 10

## 2019-10-04 MED ORDER — AMOXICILLIN-POT CLAVULANATE 875-125 MG PO TABS: 1.0000 | ORAL_TABLET | Freq: Two times a day (BID) | ORAL | 0 refills | Status: AC

## 2019-10-04 MED ORDER — TRAMADOL HCL 50 MG PO TABS
50.0000 mg | ORAL_TABLET | Freq: Once | ORAL | Status: AC
  Administered 2019-10-04: 50 mg via ORAL
  Filled 2019-10-04: qty 1

## 2019-10-04 MED ORDER — STERILE WATER FOR INJECTION IJ SOLN
INTRAMUSCULAR | Status: AC
  Filled 2019-10-04: qty 10

## 2019-10-04 NOTE — ED Notes (Signed)
Attempted bladder scan, pt. Stating it was too painful to continue scanning the bladder.

## 2019-10-05 ENCOUNTER — Encounter (HOSPITAL_BASED_OUTPATIENT_CLINIC_OR_DEPARTMENT_OTHER): Payer: Medicaid Other | Attending: Internal Medicine | Admitting: Internal Medicine

## 2019-10-05 ENCOUNTER — Other Ambulatory Visit: Payer: Self-pay

## 2019-10-05 DIAGNOSIS — F259 Schizoaffective disorder, unspecified: Secondary | ICD-10-CM | POA: Insufficient documentation

## 2019-10-05 DIAGNOSIS — L89313 Pressure ulcer of right buttock, stage 3: Secondary | ICD-10-CM | POA: Insufficient documentation

## 2019-10-05 DIAGNOSIS — Z993 Dependence on wheelchair: Secondary | ICD-10-CM | POA: Insufficient documentation

## 2019-10-05 DIAGNOSIS — F4310 Post-traumatic stress disorder, unspecified: Secondary | ICD-10-CM | POA: Insufficient documentation

## 2019-10-05 DIAGNOSIS — R532 Functional quadriplegia: Secondary | ICD-10-CM | POA: Diagnosis not present

## 2019-10-05 DIAGNOSIS — Z933 Colostomy status: Secondary | ICD-10-CM | POA: Insufficient documentation

## 2019-10-05 DIAGNOSIS — K5090 Crohn's disease, unspecified, without complications: Secondary | ICD-10-CM | POA: Diagnosis not present

## 2019-10-05 DIAGNOSIS — I10 Essential (primary) hypertension: Secondary | ICD-10-CM | POA: Diagnosis not present

## 2019-10-05 DIAGNOSIS — M069 Rheumatoid arthritis, unspecified: Secondary | ICD-10-CM | POA: Insufficient documentation

## 2019-10-05 DIAGNOSIS — G800 Spastic quadriplegic cerebral palsy: Secondary | ICD-10-CM | POA: Insufficient documentation

## 2019-10-05 DIAGNOSIS — L89153 Pressure ulcer of sacral region, stage 3: Secondary | ICD-10-CM | POA: Diagnosis not present

## 2019-10-06 LAB — URINE CULTURE: Culture: >=100000 — AB

## 2019-10-07 ENCOUNTER — Telehealth: Payer: Self-pay

## 2019-10-07 NOTE — Telephone Encounter (Signed)
Post ED Visit - Positive Culture Follow-up  Culture report reviewed by antimicrobial stewardship pharmacist: [place] Team [x]  Elenor Quinones, Pharm.D. []  Heide Guile, Pharm.D., BCPS AQ-ID []  Parks Neptune, Pharm.D., BCPS []  Alycia Rossetti, Pharm.D., BCPS []  [place], Pharm.D., BCPS, AAHIVP []  Legrand Como, Pharm.D., BCPS, AAHIVP []  Salome Arnt, PharmD, BCPS []  Johnnette Gourd, PharmD, BCPS []  Hughes Better, PharmD, BCPS []  Leeroy Cha, PharmD []  Laqueta Linden, PharmD, BCPS []  Albertina Parr, PharmD  [place] Team []  Leodis Sias, PharmD []  Lindell Spar, PharmD []  Royetta Asal, PharmD []  Graylin Shiver, Rph []  Rema Fendt) Glennon Mac, PharmD []  Arlyn Dunning, PharmD []  Netta Cedars, PharmD []  Dia Sitter, PharmD []  Leone Haven, PharmD []  Gretta Arab, PharmD []  Theodis Shove, PharmD []  Peggyann Juba, PharmD []  Reuel Boom, PharmD   Positive urine culture Treated with Amoxicillin, organism sensitive to the same and no further patient follow-up is required at this time.  Dason Mosley, Carolynn Comment 10/07/2019, 10:00 AM

## 2019-10-09 LAB — CULTURE, BLOOD (ROUTINE X 2)
Culture: NO GROWTH
Special Requests: ADEQUATE

## 2019-10-12 NOTE — Progress Notes (Signed)
LYNIX, BONINE ([id number]) Visit Report for 09/10/2019 Arrival Information Details Patient Name: Date of Service: LIBERTIE, HAUSLER 09/10/2019 12:30 PM Medical Record [id number] Patient Account Number: [id number] Date of Birth/Sex: Treating RN: 07/13/1981 (38 y.o. Orvan Falconer Primary Care Deaven Barron: Clovia Cuff Other Clinician: Referring Brookes Craine: Treating Malcom Selmer/Extender:Robson, Esperanza Richters in Treatment: 2 Visit Information History Since Last Visit All ordered tests and consults were completed: No Patient Arrived: Wheel Chair Added or deleted any medications: No Arrival Time: 13:27 Any new allergies or adverse reactions: No Accompanied By: self Had a fall or experienced change in No activities of daily living that may affect Transfer Assistance: Harrel Lemon Lift risk of falls: Patient Identification Verified: Yes Signs or symptoms of abuse/neglect since last No Secondary Verification Process Completed: Yes visito Patient Requires Transmission-Based No Hospitalized since last visit: No Precautions: Implantable device outside of the clinic excluding No Patient Has Alerts: No cellular tissue based products placed in the center since last visit: Has Dressing in Place as Prescribed: Yes Pain Present Now: No Electronic Signature(s) Signed: 10/12/2019 2:48:51 PM By: Carlene Coria RN Entered By: Carlene Coria on 09/10/2019 13:27:36 -------------------------------------------------------------------------------- Clinic Level of Care Assessment Details Patient Name: Date of Service: AZJAH, PARDO 09/10/2019 12:30 PM Medical Record [id number] Patient Account Number: [id number] Date of Birth/Sex: Treating RN: 01-26-1981 (38 y.o. Clearnce Sorrel Primary Care Reinhart Saulters: Clovia Cuff Other Clinician: Referring Maricel Swartzendruber: Treating Hasan Douse/Extender:Robson, Esperanza Richters in Treatment: 2 Clinic Level of Care Assessment Items TOOL 4 Quantity Score X - Use when only  an EandM is performed on FOLLOW-UP visit 1 0 ASSESSMENTS - Nursing Assessment / Reassessment X - Reassessment of Co-morbidities (includes updates in patient status) 1 10 X - Reassessment of Adherence to Treatment Plan 1 5 ASSESSMENTS - Wound and Skin Assessment / Reassessment []  - Simple Wound Assessment / Reassessment - one wound 0 X - Complex Wound Assessment / Reassessment - multiple wounds 2 5 []  - Dermatologic / Skin Assessment (not related to wound area) 0 ASSESSMENTS - Focused Assessment []  - Circumferential Edema Measurements - multi extremities 0 []  - Nutritional Assessment / Counseling / Intervention 0 []  - Lower Extremity Assessment (monofilament, tuning fork, pulses) 0 []  - Peripheral Arterial Disease Assessment (using hand held doppler) 0 ASSESSMENTS - Ostomy and/or Continence Assessment and Care []  - Incontinence Assessment and Management 0 []  - Ostomy Care Assessment and Management (repouching, etc.) 0 PROCESS - Coordination of Care []  - Simple Patient / Family Education for ongoing care 0 X - Complex (extensive) Patient / Family Education for ongoing care 1 20 X - Staff obtains Programmer, systems, Records, Test Results / Process Orders 1 10 X - Staff telephones HHA, Nursing Homes / Clarify orders / etc 1 10 []  - Routine Transfer to another Facility (non-emergent condition) 0 []  - Routine Hospital Admission (non-emergent condition) 0 []  - New Admissions / Biomedical engineer / Ordering NPWT, Apligraf, etc. 0 []  - Emergency Hospital Admission (emergent condition) 0 []  - Simple Discharge Coordination 0 X - Complex (extensive) Discharge Coordination 1 15 PROCESS - Special Needs []  - Pediatric / Minor Patient Management 0 []  - Isolation Patient Management 0 []  - Hearing / Language / Visual special needs 0 []  - Assessment of Community assistance (transportation, D/C planning, etc.) 0 []  - Additional assistance / Altered mentation 0 []  - Support Surface(s) Assessment (bed,  cushion, seat, etc.) 0 INTERVENTIONS - Wound Cleansing / Measurement []  - Simple Wound Cleansing - one wound 0 X - Complex Wound Cleansing - multiple wounds 2 5 X -  Wound Imaging (photographs - any number of wounds) 1 5 []  - Wound Tracing (instead of photographs) 0 []  - Simple Wound Measurement - one wound 0 X - Complex Wound Measurement - multiple wounds 2 5 INTERVENTIONS - Wound Dressings X - Small Wound Dressing one or multiple wounds 2 10 []  - Medium Wound Dressing one or multiple wounds 0 []  - Large Wound Dressing one or multiple wounds 0 X - Application of Medications - topical 1 5 []  - Application of Medications - injection 0 INTERVENTIONS - Miscellaneous []  - External ear exam 0 []  - Specimen Collection (cultures, biopsies, blood, body fluids, etc.) 0 []  - Specimen(s) / Culture(s) sent or taken to Lab for analysis 0 []  - Patient Transfer (multiple staff / Civil Service fast streamer / Similar devices) 0 []  - Simple Staple / Suture removal (25 or less) 0 []  - Complex Staple / Suture removal (26 or more) 0 []  - Hypo / Hyperglycemic Management (close monitor of Blood Glucose) 0 []  - Ankle / Brachial Index (ABI) - do not check if billed separately 0 X - Vital Signs 1 5 Has the patient been seen at the hospital within the last three years: Yes Total Score: 135 Level Of Care: New/Established - Level 4 Electronic Signature(s) Signed: 09/10/2019 6:04:56 PM By: Kela Millin Entered By: Kela Millin on 09/10/2019 14:03:41 -------------------------------------------------------------------------------- Encounter Discharge Information Details Patient Name: Date of Service: IVETT, LUEBBE 09/10/2019 12:30 PM Medical Record [id number] Patient Account Number: [id number] Date of Birth/Sex: Treating RN: [birth year] (38 y.o. Debby Bud Primary Care Mayetta Castleman: Clovia Cuff Other Clinician: Referring Candid Bovey: Treating Jeson Camacho/Extender:Robson, Esperanza Richters in Treatment:  2 Encounter Discharge Information Items Discharge Condition: Stable Ambulatory Status: Wheelchair Discharge Destination: Home Transportation: Private Auto Accompanied By: caregiver Schedule Follow-up Appointment: Yes Clinical Summary of Care: Notes Per patient provided permission to allow caregiver into room for discharge instructions. Per caregiver unsure what the signs and symptoms of infection and she noted an odor last Tuesday called EMS and [facility] admitted patient x5 days and discharged on 09/04/2019. 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 Explained to patient and caregiver would make Dr. Dellia Nims aware. MD and case manager notified. Electronic Signature(s) Signed: 09/10/2019 5:26:58 PM By: Deon Pilling Entered By: Deon Pilling on 09/10/2019 15:53:03 -------------------------------------------------------------------------------- Multi Wound Chart Details Patient Name: Date of Service: LUCILLA, PETRENKO 09/10/2019 12:30 PM Medical Record [id number] Patient Account Number: [id number] Date of Birth/Sex: Treating RN: [birth year] (38 y.o. F) Primary Care Tacia Hindley: Clovia Cuff Other Clinician: Referring Keyan Folson: Treating Monifa Blanchette/Extender:Robson, Esperanza Richters in Treatment: 2 Vital Signs Height(in): 67 Pulse(bpm): 81 Weight(lbs): 145 Blood Pressure(mmHg): 116/66 Body Mass Index(BMI): 23 Temperature(F): 98.3 Respiratory 18 Rate(breaths/min): Photos: [1:No Photos] [2:No Photos] [N/A:N/A] Wound Location: [1:Sacrum] [2:Right Gluteus] [N/A:N/A] Wounding Event: [1:Pressure Injury] [2:Pressure Injury] [N/A:N/A] Primary Etiology: [1:Pressure Ulcer] [2:Pressure Ulcer] [N/A:N/A] Comorbid History: [1:Aspiration, Crohns, Rheumatoid Arthritis, Quadriplegia, Paraplegia] [2:Aspiration, Crohns, Rheumatoid Arthritis, Quadriplegia, Paraplegia] [N/A:N/A] Date Acquired: [1:05/21/2019] [2:05/21/2019] [N/A:N/A] Weeks of Treatment: [1:2] [2:2] [N/A:N/A] Wound  Status: [1:Open] [2:Open] [N/A:N/A] Measurements L x W x D 2x1x0.2 [2:1.6x1.1x1.3] [N/A:N/A] (cm) Area (cm) : [1:1.571] [2:1.382] [N/A:N/A] Volume (cm) : [1:0.314] [2:1.797] [N/A:N/A] % Reduction in Area: [1:-11.10%] [2:-134.60%] [N/A:N/A] % Reduction in Volume: 25.90% [2:-915.30%] [N/A:N/A] Classification: [1:Category/Stage III] [2:Category/Stage III] [N/A:N/A] Exudate Amount: [1:Small] [2:Medium] [N/A:N/A] Exudate Type: [1:Serosanguineous] [2:Serosanguineous] [N/A:N/A] Exudate Color: [1:red, brown] [2:red, brown] [N/A:N/A] Wound Margin: [1:Flat and Intact] [2:Flat and Intact] [N/A:N/A] Granulation Amount: [1:Medium (34-66%)] [2:Medium (34-66%)] [N/A:N/A] Granulation Quality: [1:Pink, Pale] [2:Pink, Pale] [N/A:N/A] Necrotic Amount: [1:Medium (  34-66%)] [2:Medium (34-66%)] [N/A:N/A] Exposed Structures: [1:Fat Layer (Subcutaneous Tissue) Exposed: Yes Fascia: No Tendon: No Muscle: No Joint: No Bone: No None] [2:Fat Layer (Subcutaneous Tissue) Exposed: Yes Fascia: No Tendon: No Muscle: No Joint: No Bone: No None] [N/A:N/A N/A] Treatment Notes Electronic Signature(s) Signed: 09/12/2019 8:54:13 AM By: Linton Ham MD Entered By: Linton Ham on 09/10/2019 13:59:43 -------------------------------------------------------------------------------- Multi-Disciplinary Care Plan Details Patient Name: Date of Service: DANNIELLA, ROBBEN 09/10/2019 12:30 PM Medical Record [id number] Patient Account Number: [id number] Date of Birth/Sex: Treating RN: May 17, 1981 (38 y.o. F) Dwiggins, Larene Beach Primary Care Nello Corro: Clovia Cuff Other Clinician: Referring Chistian Kasler: Treating Yossi Hinchman/Extender:Robson, Esperanza Richters in Treatment: 2 Active Inactive Pressure Nursing Diagnoses: Knowledge deficit related to causes and risk factors for pressure ulcer development Knowledge deficit related to management of pressures ulcers Potential for impaired tissue integrity related to pressure, friction,  moisture, and shear Goals: Patient/caregiver will verbalize risk factors for pressure ulcer development Date Initiated: 08/27/2019 Target Resolution Date: 09/24/2019 Goal Status: Active Patient/caregiver will verbalize understanding of pressure ulcer management Date Initiated: 08/27/2019 Target Resolution Date: 09/24/2019 Goal Status: Active Interventions: Assess: immobility, friction, shearing, incontinence upon admission and as needed Assess offloading mechanisms upon admission and as needed Assess potential for pressure ulcer upon admission and as needed Provide education on pressure ulcers Notes: Wound/Skin Impairment Nursing Diagnoses: Impaired tissue integrity Knowledge deficit related to ulceration/compromised skin integrity Goals: Patient/caregiver will verbalize understanding of skin care regimen Date Initiated: 08/27/2019 Target Resolution Date: 09/24/2019 Goal Status: Active Ulcer/skin breakdown will have a volume reduction of 30% by week 4 Date Initiated: 08/27/2019 Target Resolution Date: 09/24/2019 Goal Status: Active Interventions: Assess patient/caregiver ability to obtain necessary supplies Assess patient/caregiver ability to perform ulcer/skin care regimen upon admission and as needed Assess ulceration(s) every visit Provide education on ulcer and skin care Notes: Electronic Signature(s) Signed: 09/10/2019 6:04:56 PM By: Kela Millin Entered By: Kela Millin on 09/10/2019 13:53:09 -------------------------------------------------------------------------------- Pain Assessment Details Patient Name: Date of Service: ANAIZA, BEHRENS 09/10/2019 12:30 PM Medical Record [id number] Patient Account Number: [id number] Date of Birth/Sex: Treating RN: 02-Oct-1981 (38 y.o. Orvan Falconer Primary Care Andrina Locken: Clovia Cuff Other Clinician: Referring Lasandra Batley: Treating Nocholas Damaso/Extender:Robson, Esperanza Richters in Treatment: 2 Active  Problems Location of Pain Severity and Description of Pain Patient Has Paino No Site Locations Pain Management and Medication Current Pain Management: Electronic Signature(s) Signed: 10/12/2019 2:48:51 PM By: Carlene Coria RN Entered By: Carlene Coria on 09/10/2019 13:28:10 -------------------------------------------------------------------------------- Patient/Caregiver Education Details Patient Name: Date of Service: LOVINIA, SNARE 11/6/2020andnbsp12:30 PM Medical Record [id number] Patient Account Number: [id number] Date of Birth/Gender: Treating RN: September 14, 1981 (37 y.o. Clearnce Sorrel Primary Care Physician: Clovia Cuff Other Clinician: Referring Physician: Treating Physician/Extender:Robson, Esperanza Richters in Treatment: 2 Education Assessment Education Provided To: Patient Education Topics Provided Pressure: Methods: Explain/Verbal Responses: State content correctly Wound/Skin Impairment: Methods: Explain/Verbal Responses: State content correctly Electronic Signature(s) Signed: 09/10/2019 6:04:56 PM By: Kela Millin Entered By: Kela Millin on 09/10/2019 13:53:28 -------------------------------------------------------------------------------- Wound Assessment Details Patient Name: Date of Service: LOVELYN, SHEERAN 09/10/2019 12:30 PM Medical Record [id number] Patient Account Number: [id number] Date of Birth/Sex: Treating RN: 1980/11/05 (38 y.o. Orvan Falconer Primary Care Malvern Kadlec: Clovia Cuff Other Clinician: Referring Temeka Pore: Treating Dresden Ament/Extender:Robson, Esperanza Richters in Treatment: 2 Wound Status Wound Number: 1 Primary Pressure Ulcer Etiology: Wound Location: Sacrum Wound Open Wounding Event: Pressure Injury Status: Date Acquired: 05/21/2019 Comorbid Aspiration, Crohns, Rheumatoid Arthritis, Weeks Of Treatment: 2 History: Quadriplegia, Paraplegia Clustered Wound: No Photos Wound Measurements Length: (cm) 2 %  Reducti Width: (cm) 1 % Reducti  Depth: (cm) 0.2 Epithelia Area: (cm) 1.571 Tunnelin Volume: (cm) 0.314 Undermin Wound Description Classification: Category/Stage III Foul Odo Wound Margin: Flat and Intact Slough/F Exudate Amount: Small Exudate Type: Serosanguineous Exudate Color: red, brown Wound Bed Granulation Amount: Medium (34-66%) Granulation Quality: Pink, Pale Fascia Expo Necrotic Amount: Medium (34-66%) Fat Layer ( Necrotic Quality: Adherent Slough Tendon Expo Muscle Expo Joint Expos Bone Expose r After Cleansing: No ibrino Yes Exposed Structure sed: No Subcutaneous Tissue) Exposed: Yes sed: No sed: No ed: No d: No on in Area: -11.1% on in Volume: 25.9% lization: None g: No ing: No Electronic Signature(s) Signed: 09/15/2019 4:27:20 PM By: Mikeal Hawthorne EMT/HBOT Signed: 10/12/2019 2:48:51 PM By: Carlene Coria RN Entered By: Mikeal Hawthorne on 09/15/2019 13:40:12 -------------------------------------------------------------------------------- Wound Assessment Details Patient Name: Date of Service: DECLYN, OFFIELD 09/10/2019 12:30 PM Medical Record [id number] Patient Account Number: [id number] Date of Birth/Sex: Treating RN: 29-Sep-1981 (38 y.o. Orvan Falconer Primary Care Treavon Castilleja: Clovia Cuff Other Clinician: Referring Jeanette Rauth: Treating Anthonny Schiller/Extender:Robson, Esperanza Richters in Treatment: 2 Wound Status Wound Number: 2 Primary Pressure Ulcer Etiology: Wound Location: Right Gluteus Wound Open Wounding Event: Pressure Injury Status: Date Acquired: 05/21/2019 Comorbid Aspiration, Crohns, Rheumatoid Arthritis, Weeks Of Treatment: 2 History: Quadriplegia, Paraplegia Clustered Wound: No Photos Wound Measurements Length: (cm) 1.6 Width: (cm) 1.1 Depth: (cm) 1.3 Area: (cm) 1.382 Volume: (cm) 1.797 Wound Description Classification: Category/Stage III Wound Margin: Flat and Intact Exudate Amount: Medium Exudate Type:  Serosanguineous Exudate Color: red, brown Wound Bed Granulation Amount: Medium (34-66%) Granulation Quality: Pink, Pale Necrotic Amount: Medium (34-66%) Necrotic Quality: Adherent Slough After Cleansing: No rino Yes Exposed Structure sed: No Subcutaneous Tissue) Exposed: Yes sed: No sed: No ed: No d: No % Reduction in Area: -134.6% % Reduction in Volume: -915.3% Epithelialization: None Tunneling: No Undermining: No Foul Odor Slough/Fib Fascia Expo Fat Layer ( Tendon Expo Muscle Expo Joint Expos Bone Expose Electronic Signature(s) Signed: 09/15/2019 4:27:20 PM By: Mikeal Hawthorne EMT/HBOT Signed: 10/12/2019 2:48:51 PM By: Carlene Coria RN Entered By: Mikeal Hawthorne on 09/15/2019 13:39:41 -------------------------------------------------------------------------------- Vitals Details Patient Name: Date of Service: ARNETTA, ODEH 09/10/2019 12:30 PM Medical Record [id number] Patient Account Number: [id number] Date of Birth/Sex: Treating RN: Apr 09, 1981 (38 y.o. Orvan Falconer Primary Care Gerline Ratto: Clovia Cuff Other Clinician: Referring Abdulkareem Badolato: Treating Riane Rung/Extender:Robson, Esperanza Richters in Treatment: 2 Vital Signs Time Taken: 13:27 Temperature (F): 98.3 Height (in): 67 Pulse (bpm): 79 Weight (lbs): 145 Respiratory Rate (breaths/min): 18 Body Mass Index (BMI): 22.7 Blood Pressure (mmHg): 116/66 Reference Range: 80 - 120 mg / dl Electronic Signature(s) Signed: 10/12/2019 2:48:51 PM By: Carlene Coria RN Entered By: Carlene Coria on 09/10/2019 13:28:03

## 2019-10-12 NOTE — Progress Notes (Signed)
Pamela Fuller, Pamela Fuller ([id number]) Visit Report for 08/27/2019 Allergy List Details Patient Name: Date of Service: Pamela Fuller, Pamela Fuller 08/27/2019 1:15 PM Medical Record [id number] Patient Account Number: [id number] Date of Birth/Sex: Treating RN: 04-Feb-1981 (38 y.o. Pamela Fuller Primary Care Pamela Fuller: Pamela Fuller Other Clinician: Referring Pamela Fuller: Treating Pamela Fuller/Extender:Pamela Fuller in Treatment: 0 Allergies Active Allergies Sulfa (Sulfonamide Antibiotics) Quinolones colace codeine lactose Allergy Notes Electronic Signature(s) Signed: 10/12/2019 3:01:15 PM By: Pamela Coria RN Entered By: Pamela Fuller on 08/27/2019 14:06:43 -------------------------------------------------------------------------------- Arrival Information Details Patient Name: Date of Service: Pamela Fuller 08/27/2019 1:15 PM Medical Record [id number] Patient Account Number: [id number] Date of Birth/Sex: Treating RN: 06-25-1981 (38 y.o. Pamela Fuller Primary Care Trell Secrist: Pamela Fuller Other Clinician: Referring Kingdavid Leinbach: Treating Pamela Fuller/Extender:Pamela Fuller in Treatment: 0 Visit Information Patient Arrived: Wheel Chair Arrival Time: 14:02 Accompanied By: caregiver Transfer Assistance: Harrel Lemon Lift Patient Identification Verified: Yes Secondary Verification Process Completed: Yes Patient Requires Transmission-Based No Precautions: Patient Has Alerts: No Electronic Signature(s) Signed: 10/12/2019 3:01:15 PM By: Pamela Coria RN Entered By: Pamela Fuller on 08/27/2019 14:02:48 -------------------------------------------------------------------------------- Clinic Level of Care Assessment Details Patient Name: Date of Service: Pamela Fuller 08/27/2019 1:15 PM Medical Record [id number] Patient Account Number: [id number] Date of Birth/Sex: Treating RN: 04-04-1981 (38 y.o. Pamela Fuller Primary Care Roxene Alviar: Pamela Fuller Other Clinician: Referring  Oronde Hallenbeck: Treating Pamela Fuller/Extender:Pamela Fuller in Treatment: 0 Clinic Level of Care Assessment Items TOOL 1 Quantity Score X - Use when EandM and Procedure is performed on INITIAL visit 1 0 ASSESSMENTS - Nursing Assessment / Reassessment X - General Physical Exam (combine w/ comprehensive assessment (listed just below) 1 20 when performed on new pt. evals) X - Comprehensive Assessment (HX, ROS, Risk Assessments, Wounds Hx, etc.) 1 25 ASSESSMENTS - Wound and Skin Assessment / Reassessment []  - Dermatologic / Skin Assessment (not related to wound area) 0 ASSESSMENTS - Ostomy and/or Continence Assessment and Care []  - Incontinence Assessment and Management 0 []  - Ostomy Care Assessment and Management (repouching, etc.) 0 PROCESS - Coordination of Care X - Simple Patient / Family Education for ongoing care 1 15 []  - Complex (extensive) Patient / Family Education for ongoing care 0 X - Staff obtains Programmer, systems, Records, Test Results / Process Orders 1 10 []  - Staff telephones HHA, Nursing Homes / Clarify orders / etc 0 []  - Routine Transfer to another Facility (non-emergent condition) 0 []  - Routine Hospital Admission (non-emergent condition) 0 X - New Admissions / Biomedical engineer / Ordering NPWT, Apligraf, etc. 1 15 []  - Emergency Hospital Admission (emergent condition) 0 PROCESS - Special Pamela Fuller []  - Pediatric / Minor Patient Management 0 []  - Isolation Patient Management 0 []  - Hearing / Language / Visual special Pamela Fuller 0 []  - Assessment of Community assistance (transportation, D/C planning, etc.) 0 []  - Additional assistance / Altered mentation 0 []  - Support Surface(s) Assessment (bed, cushion, seat, etc.) 0 INTERVENTIONS - Miscellaneous []  - External ear exam 0 []  - Patient Transfer (multiple staff / Civil Service fast streamer / Similar devices) 0 []  - Simple Staple / Suture removal (25 or less) 0 []  - Complex Staple / Suture removal (26 or more) 0 []  - Hypo/Hyperglycemic  Management (do not check if billed separately) 0 []  - Ankle / Brachial Index (ABI) - do not check if billed separately 0 Has the patient been seen at the hospital within the last three years: Yes Total Score: 85 Level Of Care: New/Established - Level 3 Electronic Signature(s) Signed: 08/27/2019 5:59:58 PM By: Pamela Hurst RN, BSN  Entered By: Pamela Fuller on 08/27/2019 17:46:34 -------------------------------------------------------------------------------- Encounter Discharge Information Details Patient Name: Date of Service: Pamela Fuller, Pamela Fuller 08/27/2019 1:15 PM Medical Record [id number] Patient Account Number: [id number] Date of Birth/Sex: Treating RN: 12/03/1980 (38 y.o. Pamela Fuller Primary Care Onisha Cedeno: Pamela Fuller Other Clinician: Referring Piotr Christopher: Treating Kamaury Cutbirth/Extender:Pamela Fuller in Treatment: 0 Encounter Discharge Information Items Post Procedure Vitals Discharge Condition: Stable Temperature (F): 98.6 Ambulatory Status: Wheelchair Pulse (bpm): 89 Discharge Destination: Home Respiratory Rate (breaths/min): 18 Transportation: Private Auto Blood Pressure (mmHg): 123/77 Accompanied By: alone Schedule Follow-up Appointment: Yes Clinical Summary of Care: Patient Declined Electronic Signature(s) Signed: 08/27/2019 5:59:58 PM By: Pamela Hurst RN, BSN Entered By: Pamela Fuller on 08/27/2019 17:48:07 -------------------------------------------------------------------------------- Multi Wound Chart Details Patient Name: Date of Service: Pamela Fuller, Pamela Fuller 08/27/2019 1:15 PM Medical Record [id number] Patient Account Number: [id number] Date of Birth/Sex: Treating RN: 11/14/1980 (38 y.o. 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 Ulcer] [2:Pressure Ulcer] [N/A:N/A] Comorbid History: [1:Aspiration, Crohns, Rheumatoid Arthritis, Quadriplegia, Paraplegia] [2:Aspiration, Crohns, Rheumatoid Arthritis, Quadriplegia, Paraplegia] [N/A:N/A] Date Acquired: [1:05/21/2019] [2:05/21/2019] [N/A:N/A] Weeks of Treatment: [1:0] [2:0] [N/A:N/A] Wound Status: [1:Open] [2:Open] [N/A:N/A] Measurements L x W x D 3x0.6x0.3 [2:1.5x0.5x0.3] [N/A:N/A] (cm) Area (cm) : [1:1.414] [2:0.589] [N/A:N/A] Volume (cm) : [1:0.424] [2:0.177] [N/A:N/A] % Reduction in Area: [1:0.00%] [2:0.00%] [N/A:N/A] % Reduction in Volume: 0.00% [2:0.00%] [N/A:N/A] Classification: [1:Category/Stage III] [2:Category/Stage III] [N/A:N/A] Exudate Amount: [1:Small] [2:Medium] [N/A:N/A] Exudate Type: [1:Serosanguineous] [2:Serosanguineous] [N/A:N/A] Exudate Color: [1:red, brown] [2:red, brown] [N/A:N/A] Wound Margin: [1:Flat and Intact] [2:Flat and Intact] [N/A:N/A] Granulation Amount: [1:Medium (34-66%)] [2:Medium (34-66%)] [N/A:N/A] Granulation Quality: [1:Pink, Pale] [2:Pink, Pale] [N/A:N/A] Necrotic Amount: [1:Medium (34-66%)] [2:Medium (34-66%)] [N/A:N/A] Exposed Structures: [1:Fat Layer (Subcutaneous Tissue) Exposed: Yes Fascia: No Tendon: No Muscle: No Joint: No Bone: No] [2:Fat Layer (Subcutaneous Tissue) Exposed: Yes Fascia: No Tendon: No Muscle: No Joint: No Bone: No] [N/A:N/A] Epithelialization: [1:None] [2:None] [N/A:N/A] Debridement: [1:Debridement - Excisional] [2:N/A] [N/A:N/A] Pre-procedure [1:14:44] [2:N/A] [N/A:N/A] Verification/Time Out Taken: Tissue Debrided: [1:Subcutaneous, Slough] [2:N/A] [N/A:N/A] Level: [1:Skin/Subcutaneous Tissue] [2:N/A]  [N/A:N/A] Debridement Area (sq cm):1.8 [2:N/A] [N/A:N/A] Instrument: [1:Curette] [2:N/A] [N/A:N/A] Bleeding: [1:Minimum] [2:N/A] [N/A:N/A] Hemostasis Achieved: [1:Pressure] [2:N/A] [N/A:N/A] Procedural Pain: [1:0] [2:N/A] [N/A:N/A] Post Procedural Pain: [1:0] [2:N/A] [N/A:N/A] Debridement Treatment Procedure was tolerated [2:N/A] [N/A:N/A] Response: [1:well] Post Debridement [1:3x0.6x0.3] [2:N/A] [N/A:N/A] Measurements L x W x D (cm) Post Debridement [1:0.424] [2:N/A] [N/A:N/A] Volume: (cm) Post Debridement Stage: Category/Stage III [2:N/A N/A] [N/A:N/A N/A] Treatment Notes Electronic Signature(s) Signed: 08/27/2019 5:59:58 PM By: Pamela Hurst RN, BSN Signed: 08/27/2019 6:06:00 PM By: Linton Ham MD Entered By: Linton Ham on 08/27/2019 14:56:57 -------------------------------------------------------------------------------- Multi-Disciplinary Care Plan Details Patient Name: Date of Service: Pamela Fuller, Pamela Fuller 08/27/2019 1:15 PM Medical Record [id number] Patient Account Number: [id number] Date of Birth/Sex: Treating RN: 03/17/1981 (38 y.o. Pamela Fuller Primary Care Xzayvier Fagin: Pamela Fuller Other Clinician: Referring Coreyon Nicotra: Treating Mehak Roskelley/Extender:Pamela Fuller in Treatment: 0 Active Inactive Pressure Nursing Diagnoses: Knowledge deficit related to causes and risk factors for pressure ulcer development Knowledge deficit related to management of pressures ulcers Potential for impaired tissue integrity related to pressure, friction, moisture, and shear Goals: Patient/caregiver will verbalize risk factors for pressure ulcer development Date Initiated: 08/27/2019 Target Resolution Date: 09/24/2019 Goal Status:  Active Patient/caregiver will verbalize understanding of pressure ulcer management Date Initiated: 08/27/2019 Target Resolution Date: 09/24/2019 Goal Status: Active Interventions: Assess: immobility, friction, shearing, incontinence  upon admission and as needed Assess offloading mechanisms upon admission and as needed Assess potential for pressure ulcer upon admission and as needed Provide education on pressure ulcers Notes: Wound/Skin Impairment Nursing Diagnoses: Impaired tissue integrity Knowledge deficit related to ulceration/compromised skin integrity Goals: Patient/caregiver will verbalize understanding of skin care regimen Date Initiated: 08/27/2019 Target Resolution Date: 09/24/2019 Goal Status: Active Ulcer/skin breakdown will have a volume reduction of 30% by week 4 Date Initiated: 08/27/2019 Target Resolution Date: 09/24/2019 Goal Status: Active Interventions: Assess patient/caregiver ability to obtain necessary supplies Assess patient/caregiver ability to perform ulcer/skin care regimen upon admission and as needed Assess ulceration(s) every visit Provide education on ulcer and skin care Notes: Electronic Signature(s) Signed: 08/27/2019 5:59:58 PM By: Pamela Hurst RN, BSN Entered By: Pamela Fuller on 08/27/2019 17:45:59 -------------------------------------------------------------------------------- Pain Assessment Details Patient Name: Date of Service: Pamela Fuller, Pamela Fuller 08/27/2019 1:15 PM Medical Record [id number] Patient Account Number: [id number] Date of Birth/Sex: Treating RN: 03-20-1981 (38 y.o. Pamela Fuller Primary Care Sanye Ledesma: Pamela Fuller Other Clinician: Referring Konstance Happel: Treating Bryannah Boston/Extender:Pamela Fuller in Treatment: 0 Active Problems Location of Pain Severity and Description of Pain Patient Has Paino No Site Locations Pain Management and Medication Current Pain Management: Electronic Signature(s) Signed: 10/12/2019 3:01:15 PM By: Pamela Coria RN Entered By: Pamela Fuller on 08/27/2019 14:26:59 -------------------------------------------------------------------------------- Patient/Caregiver Education Details Patient Name: Date of  Service: Pamela Fuller, Pamela Fuller 10/23/2020andnbsp1:15 PM Medical Record [id number] Patient Account Number: [id number] Date of Birth/Gender: Treating RN: June 04, 1981 (37 y.o. Pamela Fuller Primary Care Physician: Pamela Fuller Other Clinician: Referring Physician: Treating Physician/Extender:Pamela Fuller in Treatment: 0 Education Assessment Education Provided To: Patient Education Topics Provided Pressure: Methods: Explain/Verbal Responses: State content correctly Wound/Skin Impairment: Methods: Explain/Verbal Responses: State content correctly Electronic Signature(s) Signed: 08/27/2019 5:59:58 PM By: Pamela Hurst RN, BSN Entered By: Pamela Fuller on 08/27/2019 17:46:10 -------------------------------------------------------------------------------- Wound Assessment Details Patient Name: Date of Service: Pamela Fuller, Pamela Fuller 08/27/2019 1:15 PM Medical Record [id number] Patient Account Number: [id number] Date of Birth/Sex: Treating RN: 08-06-1981 (38 y.o. Pamela Fuller Primary Care Eeshan Verbrugge: Pamela Fuller Other Clinician: Referring Amelianna Meller: Treating Albena Comes/Extender:Pamela Fuller in Treatment: 0 Wound Status Wound Number: 1 Primary Pressure Ulcer Etiology: Wound Location: Sacrum Wound Open Wounding Event: Pressure Injury Status: Date Acquired: 05/21/2019 Comorbid Aspiration, Crohns, Rheumatoid Arthritis, Weeks Of Treatment: 0 History: Quadriplegia, Paraplegia Clustered Wound: No Photos Wound Measurements Length: (cm) 3 Width: (cm) 0.6 Depth: (cm) 0.3 Area: (cm) 1.414 Volume: (cm) 0.424 Wound Description Classification: Category/Stage III Wound Margin: Flat and Intact Exudate Amount: Small Exudate Type: Serosanguineous Exudate Color: red, brown Wound Bed Granulation Amount: Medium (34-66%) Granulation Quality: Pink, Pale Necrotic Amount: Medium (34-66%) Necrotic Quality: Adherent Slough After Cleansing: No rino Yes Exposed  Structure sed: No Subcutaneous Tissue) Exposed: Yes sed: No osed: No sed: No ed: No % Reduction in Area: 0% % Reduction in Volume: 0% Epithelialization: None Tunneling: No Undermining: No Foul Odor Slough/Fib Fascia Expo Fat Layer ( Tendon Expo Muscle Exp Joint Expo Bone Expos Electronic Signature(s) Signed: 08/30/2019 4:53:58 PM By: Mikeal Hawthorne EMT/HBOT Signed: 10/12/2019 3:01:15 PM By: Pamela Coria RN Previous Signature: 08/27/2019 5:59:58 PM Version By: Pamela Hurst RN, BSN Entered By: Mikeal Hawthorne on 08/30/2019 08:52:37 -------------------------------------------------------------------------------- Wound Assessment Details Patient Name: Date of Service: Pamela Fuller, Pamela Fuller 08/27/2019 1:15 PM Medical Record [id number] Patient Account Number: [id number] Date of Birth/Sex: Treating RN: [birth year] (38 y.o. F)  Pamela Fuller Primary Care Jayna Mulnix: Pamela Fuller Other Clinician: Referring Gelena Klosinski: Treating Lawson Mahone/Extender:Pamela Fuller in Treatment: 0 Wound Status Wound Number: 2 Primary Pressure Ulcer Etiology: Wound Location: Right Gluteus Wound Open Wounding Event: Pressure Injury Status: Date Acquired: 05/21/2019 Comorbid Aspiration, Crohns, Rheumatoid Arthritis, Weeks Of Treatment: 0 History: Quadriplegia, Paraplegia Clustered Wound: No Photos Wound Measurements Length: (cm) 1.5 Width: (cm) 0.5 Depth: (cm) 0.3 Area: (cm) 0.589 Volume: (cm) 0.177 Wound Description Classification: Category/Stage III Wound Margin: Flat and Intact Exudate Amount: Medium Exudate Type: Serosanguineous Exudate Color: red, brown Wound Bed Granulation Amount: Medium (34-66%) Granulation Quality: Pink, Pale Necrotic Amount: Medium (34-66%) Necrotic Quality: Adherent Slough Foul Odor After Cleansing: No Slough/Fibrino Ye Exposed Structure Fascia Exposed: No Fat Layer (Subcutaneous Tissue) Exposed: Ye Tendon Exposed: No Muscle Exposed: No Joint  Exposed: No Bone Exposed: No % Reduction in Area: 0% % Reduction in Volume: 0% Epithelialization: None Tunneling: No Undermining: No s s Electronic Signature(s) Signed: 08/30/2019 4:53:58 PM By: Mikeal Hawthorne EMT/HBOT Signed: 10/12/2019 3:01:15 PM By: Pamela Coria RN Previous Signature: 08/27/2019 5:59:58 PM Version By: Pamela Hurst RN, BSN Entered By: Mikeal Hawthorne on 08/30/2019 08:53:45 -------------------------------------------------------------------------------- [place] Details Patient Name: Date of Service: Pamela Fuller, Pamela Fuller 08/27/2019 1:15 PM Medical Record [id number] Patient Account Number: [id number] Date of Birth/Sex: Treating RN: [birth year] (38 y.o. Pamela Fuller Primary Care Oluwadamilola Rosamond: Pamela Fuller Other Clinician: Referring Randel Hargens: Treating Aprille Sawhney/Extender:Pamela Fuller in Treatment: 0 Vital Signs Time Taken: 14:02 Temperature (F): 98.6 Height (in): 67 Pulse (bpm): 89 Source: Stated Respiratory Rate (breaths/min): 18 Weight (lbs): 145 Blood Pressure (mmHg): 123/77 Source: Stated Reference Range: 80 - 120 mg / dl Body Mass Index (BMI): 22.7 Electronic Signature(s) Signed: 10/12/2019 3:01:15 PM By: Pamela Coria RN Entered By: Pamela Fuller on 08/27/2019 14:03:49
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
## 2019-10-12 NOTE — Progress Notes (Signed)
Pamela Fuller, Pamela Fuller ([id number]) Visit Report for 08/27/2019 Abuse/Suicide Risk Screen Details Patient Name: Date of Service: Pamela Fuller, Pamela Fuller 08/27/2019 1:15 PM Medical Record [id number] Patient Account Number: [id number] Date of Birth/Sex: Treating RN: 1981-07-09 (38 y.o. Orvan Falconer Primary Care Angus Amini: Clovia Cuff Other Clinician: Referring Jabron Weese: Treating Christian Borgerding/Extender:Robson, Esperanza Richters in Treatment: 0 Abuse/Suicide Risk Screen Items Answer ABUSE RISK SCREEN: Has anyone close to you tried to hurt or harm you recentlyo No Do you feel uncomfortable with anyone in your familyo No Has anyone forced you do things that you didnt want to doo No Electronic Signature(s) Signed: 10/12/2019 3:01:15 PM By: Carlene Coria RN Entered By: Carlene Coria on 08/27/2019 14:12:29 -------------------------------------------------------------------------------- Activities of Daily Living Details Patient Name: Date of Service: Pamela Fuller, Pamela Fuller 08/27/2019 1:15 PM Medical Record [id number] Patient Account Number: [id number] Date of Birth/Sex: Treating RN: 12-08-1980 (38 y.o. Orvan Falconer Primary Care Brita Jurgensen: Clovia Cuff Other Clinician: Referring Mamoru Takeshita: Treating Mahsa Hanser/Extender:Robson, Esperanza Richters in Treatment: 0 Activities of Daily Living Items Answer Activities of Daily Living (Please select one for each item) Drive Automobile Not Able Take Medications Need Assistance Use Telephone Completely Able Care for Appearance Need Assistance Use Toilet Not Able Manus Rudd / Shower Not Able Dress Self Not Able Feed Self Completely Able Walk Not Able Get In / Out Bed Not Able Housework Not Able Prepare Meals Not Able Handle Money Need Assistance Shop for Self Not Able Electronic Signature(s) Signed: 10/12/2019 3:01:15 PM By: Carlene Coria RN Entered By: Carlene Coria on 08/27/2019  14:13:32 -------------------------------------------------------------------------------- Education Screening Details Patient Name: Date of Service: Pamela Fuller, Pamela Fuller 08/27/2019 1:15 PM Medical Record [id number] Patient Account Number: [id number] Date of Birth/Sex: Treating RN: 01/30/1981 (38 y.o. Orvan Falconer Primary Care Zoey Bidwell: Clovia Cuff Other Clinician: Referring Jahmarion Popoff: Treating Ovella Manygoats/Extender:Robson, Esperanza Richters in Treatment: 0 Primary Learner Assessed: Patient Learning Preferences/Education Level/Primary Language Learning Preference: Explanation Highest Education Level: College or Above Preferred Language: English Cognitive Barrier Language Barrier: No Translator Needed: No Memory Deficit: No Emotional Barrier: No Cultural/Religious Beliefs Affecting Medical Care: No Physical Barrier Impaired Vision: No Impaired Hearing: No Decreased Hand dexterity: No Knowledge/Comprehension Knowledge Level: High Comprehension Level: High Ability to understand written High instructions: Ability to understand verbal High instructions: Motivation Anxiety Level: Calm Cooperation: Cooperative Education Importance: Acknowledges Need Interest in Health Problems: Asks Questions Perception: Coherent Willingness to Engage in Self- High Management Activities: Readiness to Engage in Self- High Management Activities: Electronic Signature(s) Signed: 10/12/2019 3:01:15 PM By: Carlene Coria RN Entered By: Carlene Coria on 08/27/2019 14:14:05 -------------------------------------------------------------------------------- Fall Risk Assessment Details Patient Name: Date of Service: Pamela Fuller, Pamela Fuller 08/27/2019 1:15 PM Medical Record [id number] Patient Account Number: [id number] Date of Birth/Sex: Treating RN: [birth year] (38 y.o. Orvan Falconer Primary Care Fowler Antos: Clovia Cuff Other Clinician: Referring Wreatha Sturgeon: Treating Gracynn Rajewski/Extender:Robson,  Esperanza Richters in Treatment: 0 Fall Risk Assessment Items Have you had 2 or more falls in the last 12 monthso 0 No Have you had any fall that resulted in injury in the last 12 monthso 0 No FALLS RISK SCREEN History of falling - immediate or within 3 months 0 No Secondary diagnosis (Do you have 2 or more medical diagnoseso) 0 No Ambulatory aid None/bed rest/wheelchair/nurse 0 No Crutches/cane/walker 0 No Furniture 0 No Intravenous therapy Access/Saline/Heparin Lock 0 No Weak (short steps with or without shuffle, stooped but able to lift head 0 No while walking, may seek support from furniture) Impaired (short steps with shuffle, may have difficulty arising from chair, 0 No head down, impaired balance) Mental Status Oriented  to own ability 0 No Overestimates or forgets limitations 0 No Risk Level: Low Risk Score: 0 Electronic Signature(s) Signed: 10/12/2019 3:01:15 PM By: Carlene Coria RN Entered By: Carlene Coria on 08/27/2019 14:14:13 -------------------------------------------------------------------------------- Nutrition Risk Screening Details Patient Name: Date of Service: Pamela Fuller, Pamela Fuller 08/27/2019 1:15 PM Medical Record [id number] Patient Account Number: [id number] Date of Birth/Sex: Treating RN: 05/04/1981 (38 y.o. Orvan Falconer Primary Care Chinonso Linker: Clovia Cuff Other Clinician: Referring Harlei Lehrmann: Treating Kaiden Dardis/Extender:Robson, Esperanza Richters in Treatment: 0 Height (in): 67 Weight (lbs): 145 Body Mass Index (BMI): 22.7 Nutrition Risk Screening Items Score Screening NUTRITION RISK SCREEN: I have an illness or condition that made me change the kind and/or 0 No amount of food I eat I eat fewer than two meals per day 0 No I eat few fruits and vegetables, or milk products 0 No I have three or more drinks of beer, liquor or wine almost every day 0 No I have tooth or mouth problems that make it hard for me to eat 0 No I don't always have enough money to buy  the food I need 0 No I eat alone most of the time 0 No I take three or more different prescribed or over-the-counter drugs a day 1 Yes 0 No Without wanting to, I have lost or gained 10 pounds in the last six months I am not always physically able to shop, cook and/or feed myself 2 Yes Nutrition Protocols Good Risk Protocol Moderate Risk Protocol High Risk Proctocol Risk Level: Moderate Risk Score: 3 Electronic Signature(s) Signed: 10/12/2019 3:01:15 PM By: Carlene Coria RN Entered By: Carlene Coria on 08/27/2019 14:14:40

## 2019-10-12 NOTE — Progress Notes (Signed)
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 Psychiatric Complaints and Symptoms: Negative for: Claustrophobia; Suicidal Medical History: Negative for: Anorexia/bulimia; Confinement Anxiety Hematologic/Lymphatic Medical History: Negative for: Anemia; Hemophilia; Human Immunodeficiency Virus; Lymphedema; Sickle Cell Disease Immunological Medical History: Negative for: Lupus Erythematosus; Raynauds; Scleroderma Oncologic Medical History: Negative for: Received Chemotherapy; Received Radiation Immunizations Pneumococcal Vaccine: Received Pneumococcal Vaccination: No Implantable Devices None Family and Social History Cancer: Yes - Maternal Grandparents,Paternal Grandparents; Diabetes: No; Heart Disease: No; Hereditary Spherocytosis: No;  Hypertension: Yes - Maternal Grandparents; Kidney Disease: No; Lung Disease: Yes - Maternal Grandparents; Seizures: No; Stroke: No; Thyroid Problems: Yes - Paternal Grandparents; Tuberculosis: No; Never smoker; Marital Status - Divorced; Alcohol Use: Never; Drug Use: No History; Caffeine Use: Daily; Financial Concerns: No; Food, Clothing or Shelter Needs: No; Support System Lacking: No; Transportation Concerns: No Engineer, maintenance) Signed: 08/27/2019 6:06:00 PM By: Linton Ham MD Signed: 10/12/2019 3:01:15 PM By: Carlene Coria RN Entered By: Carlene Coria on 08/27/2019 14:11:57 -------------------------------------------------------------------------------- SuperBill Details Patient Name: Date of Service: DOROTHY, POLHEMUS 08/27/2019 Medical Record [id number] Patient Account Number: [id number] Date of Birth/Sex: Treating RN: 20-Jan-1981 (38 y.o. 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 Facility Procedures CPT4 Code: 12787183 Description: [place] VISIT-LEV 3 EST PT Modifier: 25 Quantity: 1 CPT4 Code: 67255001 Description: 64290 - DEB SUBQ TISSUE 20 SQ CM/< ICD-10 Diagnosis Description L89.153 Pressure ulcer of sacral region, stage 3 L89.313 Pressure ulcer of right buttock, stage 3 Modifier: Quantity: 1 Physician Procedures CPT4 Code: 3795583 Description: WC PHYS LEVEL 3 NEW PT ICD-10 Diagnosis Description L89.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

## 2019-10-27 ENCOUNTER — Emergency Department (HOSPITAL_COMMUNITY)
Admission: EM | Admit: 2019-10-27 | Discharge: 2019-10-28 | Disposition: A | Discharge status: Home / Self Care | Payer: Medicaid Other | Attending: Emergency Medicine | Admitting: Emergency Medicine

## 2019-10-27 ENCOUNTER — Other Ambulatory Visit: Payer: Self-pay

## 2019-10-27 ENCOUNTER — Encounter (HOSPITAL_COMMUNITY): Payer: Self-pay | Admitting: Emergency Medicine

## 2019-10-27 DIAGNOSIS — Z79899 Other long term (current) drug therapy: Secondary | ICD-10-CM | POA: Diagnosis not present

## 2019-10-27 DIAGNOSIS — T83010A Breakdown (mechanical) of cystostomy catheter, initial encounter: Secondary | ICD-10-CM | POA: Diagnosis not present

## 2019-10-27 DIAGNOSIS — T83510A Infection and inflammatory reaction due to cystostomy catheter, initial encounter: Secondary | ICD-10-CM | POA: Insufficient documentation

## 2019-10-27 DIAGNOSIS — N390 Urinary tract infection, site not specified: Secondary | ICD-10-CM

## 2019-10-27 DIAGNOSIS — Y658 Other specified misadventures during surgical and medical care: Secondary | ICD-10-CM | POA: Insufficient documentation

## 2019-10-27 MED ORDER — BACLOFEN 10 MG PO TABS
10.0000 mg | ORAL_TABLET | Freq: Three times a day (TID) | ORAL | Status: DC
  Administered 2019-10-27 – 2019-10-28 (×3): 10 mg via ORAL
  Filled 2019-10-27 (×3): qty 1

## 2019-10-27 MED ORDER — TRAMADOL HCL 50 MG PO TABS
50.0000 mg | ORAL_TABLET | Freq: Once | ORAL | Status: AC
  Administered 2019-10-27: 50 mg via ORAL
  Filled 2019-10-27: qty 1

## 2019-10-27 NOTE — ED Provider Notes (Addendum)
[facility] DEPT Provider Note: Georgena Spurling, MD, FACEP  CSN: [id number] MRN: [id number] ARRIVAL: 10/27/19 at [place]: [place]  10/27/19 10:56 PM Pamela Fuller is a 38 y.o. 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  She has a suprapubic cystostomy with an indwelling Foley.  She is here complaining of leaking around the Foley catheter with pain consistent with previous urinary tract infections.  She is also having an increase in generalized muscle pain primarily of the back and buttocks.  She is not aware of having a fever or chills.  She has a known sacral decubitus ulcer which is followed by the [facility].   Past Medical History:  Diagnosis Date  . Cerebral palsy ([place])     History reviewed. No pertinent surgical history.  No family history on file.  Social History   Tobacco Use  . Smoking status: Not on file  Substance Use Topics  . Alcohol use: Not on file  . Drug use: Not on file    Prior to Admission medications   Medication Sig Start Date End Date Taking? Authorizing Provider  baclofen (LIORESAL) 10 MG tablet Take 1.5 tablets (15 mg total) by mouth 3 (three) times daily. 10/03/19   Robinson, [place] N, PA-C  carvedilol (COREG) 6.25 MG tablet Take 6.25 mg by mouth 2 (two) times daily.    [provider]  dorzolamide-timolol (COSOPT) 22.3-6.8 MG/ML ophthalmic solution Place 1 drop into both eyes 2 (two) times daily.    [provider]  ketoconazole (NIZORAL) 2 % cream Apply 1 application topically daily.    [provider]  latanoprost (XALATAN) 0.005 % ophthalmic solution Place 1 drop into both eyes at bedtime.    [provider]  Polyethylene Glycol 3350 (PEG 3350) 17 GM/SCOOP POWD Take 17 g by mouth every other day.    [provider]  risperiDONE (RISPERDAL) 0.25 MG tablet Take 0.5 mg by mouth 2 (two) times  daily.    [provider]  sertraline (ZOLOFT) 100 MG tablet Take 100 mg by mouth at bedtime.    [provider]  sertraline (ZOLOFT) 50 MG tablet Take 50 mg by mouth daily.    [provider]  traMADol (ULTRAM) 50 MG tablet Take 1 tablet by mouth 3 (three) times daily. 09/04/19   [provider]  traMADol (ULTRAM) 50 MG tablet Take 1 tablet (50 mg total) by mouth every 8 (eight) hours as needed. 10/03/19   Robinson, [place] N, PA-C    Allergies Levaquin [levofloxacin], Codeine, Docusate, Quinolones, and Sulfa antibiotics   REVIEW OF SYSTEMS  Negative except as noted here or in the History of Present Illness.   PHYSICAL EXAMINATION  Initial Vital Signs Blood pressure 120/60, pulse 79, temperature 98.2 F (36.8 C), temperature source Oral, resp. rate 18, SpO2 94 %.  Examination General: female in no acute distress; appearance consistent with age of record HENT: normocephalic; atraumatic; facial hirsutism Eyes: pupils equal, round and reactive to light; extraocular muscles intact Neck: supple Heart: regular rate and rhythm Lungs: clear to auscultation bilaterally Abdomen: soft; mildly distended; bladder tenderness; midline ostomy draining greenish-brown stool; suprapubic catheter draining yellow urine; bowel sounds present Extremities: Chronic appearing deformities of lower extremities; contractures Neurologic: Awake, alert and oriented; quadriparesis Skin: Warm and dry; chronic appearing stage III decubitus ulcer of the sacrum Psychiatric: Flat affect   RESULTS  Summary of this visit's results, reviewed  and interpreted by myself:   EKG Interpretation  Date/Time:    Ventricular Rate:    PR Interval:    QRS Duration:   QT Interval:    QTC Calculation:   R Axis:     Text Interpretation:        Laboratory Studies: Results for orders placed or performed during the hospital encounter of 10/27/19 (from the past 24 hour(s))  Urinalysis,  Routine w reflex microscopic     Status: Abnormal   Collection Time: 10/27/19 11:12 PM  Result Value Ref Range   Color, Urine YELLOW YELLOW   APPearance HAZY (A) CLEAR   Specific Gravity, Urine 1.017 1.005 - 1.030   pH 6.0 5.0 - 8.0   Glucose, UA NEGATIVE NEGATIVE mg/dL   Hgb urine dipstick LARGE (A) NEGATIVE   Bilirubin Urine NEGATIVE NEGATIVE   Ketones, ur NEGATIVE NEGATIVE mg/dL   Protein, ur 30 (A) NEGATIVE mg/dL   Nitrite POSITIVE (A) NEGATIVE   Leukocytes,Ua LARGE (A) NEGATIVE   RBC / HPF >50 (H) 0 - 5 RBC/hpf   WBC, UA >50 (H) 0 - 5 WBC/hpf   Bacteria, UA MANY (A) NONE SEEN   Mucus PRESENT    Budding Yeast PRESENT   Pregnancy, urine     Status: None   Collection Time: 10/28/19 12:13 AM  Result Value Ref Range   Preg Test, Ur NEGATIVE NEGATIVE   Imaging Studies: No results found.  ED COURSE and MDM  Nursing notes, initial and subsequent vitals signs, including pulse oximetry, reviewed and interpreted by myself.  Vitals:   10/27/19 2252 10/27/19 2254  BP: 120/60   Pulse: 79   Resp: 18   Temp: 98.2 F (36.8 C)   TempSrc: Oral   SpO2: 98% 94%   Medications  baclofen (LIORESAL) tablet 10 mg (10 mg Oral Given 10/27/19 2337)  cephALEXin (KEFLEX) capsule 1,000 mg (has no administration in time range)  traMADol (ULTRAM) tablet 50 mg (50 mg Oral Given 10/27/19 2337)   Suprapubic catheter replaced using sterile technique.  Urine specimen sent for culture.  Will start patient on Keflex for catheter associated urinary tract infection.   PROCEDURES  SUPRAPUBIC TUBE PLACEMENT  Date/Time: 10/27/2019 11:13 PM Performed by: Tobe Kervin, MD Authorized by: Keona Bilyeu, MD   Consent:    Consent obtained:  Verbal   Consent given by:  Patient   Risks discussed:  Pain Universal protocol:    Procedure explained and questions answered to patient or proxy's satisfaction: yes     Relevant documents present and verified: yes     Required blood products, implants, devices,  and special equipment available: yes     Site/side marked: no     Immediately prior to procedure a time out was called: yes     Patient identity confirmed:  Verbally with patient and arm band Anesthesia (see MAR for exact dosages):    Anesthesia method:  None Procedure details:    Complexity:  Simple   Catheter type:  Foley   Catheter size:  16 Fr   Ultrasound guidance: no     Number of attempts:  1   Urine characteristics:  Cloudy Post-procedure details:    Patient tolerance of procedure:  Tolerated well, no immediate complications Comments:     Old Foley catheter removed prior to placement of new Foley catheter.  Existing ostomy utilized.   ED DIAGNOSES     ICD-10-CM   1. Suprapubic catheter dysfunction, initial encounter ([place])  T83.010A   2.  Urinary tract infection associated with cystostomy catheter, initial encounter [facility])  T83.510A    N39.0        Shanon Rosser, MD 10/28/19 9191    Shanon Rosser, MD 10/28/19 [phone]

## 2019-10-27 NOTE — ED Triage Notes (Signed)
Pt comes to ed via ems, pt reports leak in dwelling cath, hx of repeated uti, pt is non ambulatory at baseline, pt verbalizes she is in a lot of pain. V/s on arrival 140/100, hr 70, rr18, 94 spo2, 98.8 temp.

## 2019-10-28 LAB — URINALYSIS, ROUTINE W REFLEX MICROSCOPIC
Bilirubin Urine: NEGATIVE
Glucose, UA: NEGATIVE mg/dL
Ketones, ur: NEGATIVE mg/dL
Nitrite: POSITIVE — AB
Protein, ur: 30 mg/dL — AB
RBC / HPF: >50 RBC/hpf — ABNORMAL HIGH (ref 0–5)
Specific Gravity, Urine: 1.017 (ref 1.005–1.030)
WBC, UA: >50 WBC/hpf — ABNORMAL HIGH (ref 0–5)
pH: 6 (ref 5.0–8.0)

## 2019-10-28 LAB — PREGNANCY, URINE: Preg Test, Ur: NEGATIVE

## 2019-10-28 MED ORDER — FLUCONAZOLE 150 MG PO TABS
150.0000 mg | ORAL_TABLET | Freq: Once | ORAL | Status: AC
  Administered 2019-10-28: 150 mg via ORAL
  Filled 2019-10-28: qty 1

## 2019-10-28 MED ORDER — DORZOLAMIDE HCL-TIMOLOL MAL 2-0.5 % OP SOLN: 1.0000 [drp] | Freq: Two times a day (BID) | OPHTHALMIC | Status: DC

## 2019-10-28 MED ORDER — CEPHALEXIN 500 MG PO CAPS
1000.0000 mg | ORAL_CAPSULE | Freq: Once | ORAL | Status: AC
  Administered 2019-10-28: 1000 mg via ORAL
  Filled 2019-10-28: qty 2

## 2019-10-28 MED ORDER — LATANOPROST 0.005 % OP SOLN: 1.0000 [drp] | Freq: Every day | OPHTHALMIC | Status: DC

## 2019-10-28 MED ORDER — SERTRALINE HCL 50 MG PO TABS
50.0000 mg | ORAL_TABLET | Freq: Every day | ORAL | Status: DC
  Administered 2019-10-28: 50 mg via ORAL
  Filled 2019-10-28: qty 1

## 2019-10-28 MED ORDER — CEPHALEXIN 500 MG PO CAPS
500.0000 mg | ORAL_CAPSULE | Freq: Once | ORAL | Status: AC
  Administered 2019-10-28: 500 mg via ORAL
  Filled 2019-10-28: qty 1

## 2019-10-28 MED ORDER — CEPHALEXIN 500 MG PO CAPS: 500.0000 mg | ORAL_CAPSULE | Freq: Four times a day (QID) | ORAL | 0 refills | Status: DC
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
## 2019-10-28 NOTE — ED Notes (Addendum)
Called Pamela Fuller. She said she is not home but will be home before PTAR arrives.

## 2019-10-28 NOTE — Progress Notes (Addendum)
CSW received a call from [name] at the [facility] stating the patient has been offered a bed and has been accepted and that the pt can arrive on 12/24 (The AFL is a private home and not a facility).  The number for report is [phone].  Caregiver is asking that pt be given her medications prior to D/C.  CSW will update RN and EDP.  Alphonse Guild. Ludmilla Mcgillis, Latanya Presser, LCAS Clinical Social Worker Ph: [phone]
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
## 2019-10-28 NOTE — ED Notes (Signed)
PTAR called for transport back to residence-SW has been in touch with caregiver-patient is aware she is returning home-patient repositioned, tolerated oral meds-no complaints at this time
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
## 2019-10-28 NOTE — ED Notes (Signed)
Moved into hospital bed report given to next nurse.  On transfer stage 3 skin breakdown noted on lower back/ coxis  ---  Wound changed  -----  Provider notified for assessment

## 2019-10-28 NOTE — ED Notes (Signed)
PTAR departed
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
## 2019-10-28 NOTE — ED Notes (Signed)
Received report on pt. Instructed to call [name] at [phone] when Pamela Fuller arrives, so [name] will know that she needs to be home before PTAR arrives.

## 2019-10-28 NOTE — ED Notes (Signed)
Parents are on their way here from [place] to pick their daughter up for Christmas. Mother, Pamela Fuller would like someone  to call her [phone].

## 2019-10-31 LAB — URINE CULTURE: Culture: >=100000 — AB

## 2019-11-01 ENCOUNTER — Telehealth: Payer: Self-pay | Admitting: Emergency Medicine

## 2019-11-01 NOTE — Telephone Encounter (Signed)
Post ED Visit - Positive Culture Follow-up: Successful Patient Follow-Up  Culture assessed and recommendations reviewed by:  [x]  Elenor Quinones, Pharm.D. []  Heide Guile, Pharm.D., BCPS AQ-ID []  Parks Neptune, Pharm.D., BCPS []  Alycia Rossetti, Pharm.D., BCPS []  [place], Pharm.D., BCPS, AAHIVP []  Legrand Como, Pharm.D., BCPS, AAHIVP []  Salome Arnt, PharmD, BCPS []  Johnnette Gourd, PharmD, BCPS []  Hughes Better, PharmD, BCPS []  Leeroy Cha, PharmD  Positive urine culture  []  Patient discharged without antimicrobial prescription and treatment is now indicated [x]  Organism is resistant to prescribed ED discharge antimicrobial []  Patient with positive blood cultures  Changes discussed with ED provider: Alyse Low PA New antibiotic prescription stop keflex, start cefpodoxime 129m po x 5 days  Attempting to contact patient   MHazle Nordmann12/28/2020, 12:25 PM

## 2019-11-09 ENCOUNTER — Encounter (HOSPITAL_BASED_OUTPATIENT_CLINIC_OR_DEPARTMENT_OTHER): Payer: Medicaid Other | Admitting: Internal Medicine

## 2019-11-11 ENCOUNTER — Ambulatory Visit: Payer: Self-pay

## 2019-11-11 ENCOUNTER — Ambulatory Visit (INDEPENDENT_AMBULATORY_CARE_PROVIDER_SITE_OTHER): Payer: Medicaid Other | Admitting: Family Medicine

## 2019-11-11 ENCOUNTER — Ambulatory Visit (HOSPITAL_BASED_OUTPATIENT_CLINIC_OR_DEPARTMENT_OTHER)
Admission: RE | Admit: 2019-11-11 | Discharge: 2019-11-11 | Disposition: A | Discharge status: Home / Self Care | Payer: Medicaid Other | Source: Ambulatory Visit | Attending: Family Medicine | Admitting: Family Medicine

## 2019-11-11 ENCOUNTER — Encounter: Payer: Self-pay | Admitting: Family Medicine

## 2019-11-11 ENCOUNTER — Other Ambulatory Visit: Payer: Self-pay

## 2019-11-11 VITALS — Ht 67.0 in

## 2019-11-11 DIAGNOSIS — S72409A Unspecified fracture of lower end of unspecified femur, initial encounter for closed fracture: Secondary | ICD-10-CM

## 2019-11-11 DIAGNOSIS — M25561 Pain in right knee: Secondary | ICD-10-CM

## 2019-11-11 DIAGNOSIS — S72409D Unspecified fracture of lower end of unspecified femur, subsequent encounter for closed fracture with routine healing: Secondary | ICD-10-CM | POA: Insufficient documentation
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
## 2019-11-11 NOTE — Assessment & Plan Note (Addendum)
There is an effusion of the knee.  Unclear of where this came from.  She may have had a subluxation of the kneecap.  Independent review of the x-ray did not demonstrate any significant abnormality. -X-ray. -Provided Pennsaid samples. -Counseled supportive care. -Could consider injection if pain still ongoing.
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
## 2019-11-11 NOTE — Patient Instructions (Addendum)
Nice to meet you Please try ice  Please try pennsaid Please send me a message in MyChart with any questions or updates.  Please see me back in 4 weeks or sooner.   --Dr. Raeford Razor

## 2019-11-12 ENCOUNTER — Telehealth: Payer: Self-pay | Admitting: Family Medicine

## 2019-11-12 NOTE — Telephone Encounter (Signed)
Left VM for patient. If she calls back please have her speak with a nurse/CMA and inform that her right knee wasn't read as any abnormality. I would continue the ice and pennsaid for the right knee. Use the splint for transfer with the left knee unless it's too painful then we can try something else. .   If any questions then please take the best time and phone number to call and I will try to call her back.   Rosemarie Ax, MD [facility] 11/12/2019, 8:33 AM

## 2019-11-15 ENCOUNTER — Telehealth: Payer: Self-pay | Admitting: Family Medicine

## 2019-11-15 NOTE — Telephone Encounter (Signed)
Patient's caregiver called asking if they can get a letter stating the reason patient has fluid on her right knee

## 2019-11-15 NOTE — Telephone Encounter (Signed)
Patient left a voicemail on January 9th stating she took the brace off and is experiencing extreme pain. She asked how long she should keep the brace off

## 2019-11-15 NOTE — Telephone Encounter (Signed)
Spoke with patient about questions.   Rosemarie Ax, MD [facility] 11/15/2019, 12:08 PM

## 2019-11-16 ENCOUNTER — Encounter (HOSPITAL_BASED_OUTPATIENT_CLINIC_OR_DEPARTMENT_OTHER): Payer: Medicaid Other | Admitting: Internal Medicine

## 2019-11-16 ENCOUNTER — Encounter: Payer: Self-pay | Admitting: Family Medicine

## 2019-11-16 ENCOUNTER — Other Ambulatory Visit: Payer: Self-pay

## 2019-11-16 ENCOUNTER — Ambulatory Visit (INDEPENDENT_AMBULATORY_CARE_PROVIDER_SITE_OTHER): Payer: Medicaid Other | Admitting: Family Medicine

## 2019-11-16 DIAGNOSIS — M25561 Pain in right knee: Secondary | ICD-10-CM | POA: Diagnosis not present

## 2019-11-16 DIAGNOSIS — Z933 Colostomy status: Secondary | ICD-10-CM | POA: Diagnosis not present

## 2019-11-16 DIAGNOSIS — S72409A Unspecified fracture of lower end of unspecified femur, initial encounter for closed fracture: Secondary | ICD-10-CM

## 2019-11-16 DIAGNOSIS — F4310 Post-traumatic stress disorder, unspecified: Secondary | ICD-10-CM | POA: Diagnosis not present

## 2019-11-16 DIAGNOSIS — K5090 Crohn's disease, unspecified, without complications: Secondary | ICD-10-CM | POA: Insufficient documentation

## 2019-11-16 DIAGNOSIS — G800 Spastic quadriplegic cerebral palsy: Secondary | ICD-10-CM | POA: Diagnosis not present

## 2019-11-16 DIAGNOSIS — R532 Functional quadriplegia: Secondary | ICD-10-CM | POA: Diagnosis not present

## 2019-11-16 DIAGNOSIS — F329 Major depressive disorder, single episode, unspecified: Secondary | ICD-10-CM | POA: Insufficient documentation

## 2019-11-16 DIAGNOSIS — M069 Rheumatoid arthritis, unspecified: Secondary | ICD-10-CM | POA: Insufficient documentation

## 2019-11-16 DIAGNOSIS — L89154 Pressure ulcer of sacral region, stage 4: Secondary | ICD-10-CM | POA: Diagnosis not present

## 2019-11-16 DIAGNOSIS — F259 Schizoaffective disorder, unspecified: Secondary | ICD-10-CM | POA: Diagnosis not present

## 2019-11-16 DIAGNOSIS — L89153 Pressure ulcer of sacral region, stage 3: Secondary | ICD-10-CM | POA: Diagnosis present

## 2019-11-16 DIAGNOSIS — I10 Essential (primary) hypertension: Secondary | ICD-10-CM | POA: Diagnosis not present

## 2019-11-16 DIAGNOSIS — Z993 Dependence on wheelchair: Secondary | ICD-10-CM | POA: Diagnosis not present

## 2019-11-16 NOTE — Assessment & Plan Note (Signed)
Previous splint was on for roughly 8 weeks.  Has callus formation on the x-ray but still painful with transferring. -Applied's leg splint today. -Counseled on supportive care. -Counseled on caretaker to evaluate for any skin breakdown

## 2019-11-16 NOTE — Progress Notes (Signed)
Pamela Fuller - 39 y.o. female MRN [id number]  Date of birth: 09/23/1981  SUBJECTIVE:  Including CC & ROS.  Chief Complaint  Patient presents with  . Follow-up    Pamela Fuller is a 39 y.o. female that is following up for her acute left leg pain and right knee pain.  The left leg pain has become more painful since taking off the splint.  The initial splint was placed in November and was not removed until 1/7.  It was roughly in place for 8 weeks.  The pain is severe when she is transitioning with a [employer] fast streamer.  It seems to be localized to the leg.  She also notices it with any motion of the lower leg.  Denies any redness or swelling.  The right knee has improved with the Pennsaid.  She does apply the Pennsaid twice a day.  The pain can still be severe at times.  Independent review of the left knee x-ray from 1/7 shows a healing impacted fracture of the distal femur. Independent review of the right knee x-ray from 1/7 shows no acute abnormality   Review of Systems See HPI   HISTORY: Past Medical, Surgical, Social, and Family History Reviewed & Updated per EMR.   Pertinent Historical Findings include:  Past Medical History:  Diagnosis Date  . Cerebral palsy ([place])     No past surgical history on file.  Allergies  Allergen Reactions  . Levaquin [Levofloxacin] Other (See Comments)    Pt says this resulted in neurogenic bladder.   . Codeine Rash  . Docusate Rash  . Quinolones Rash  . Sulfa Antibiotics Rash    No family history on file.   Social History   Socioeconomic History  . Marital status: Single    Spouse name: Not on file  . Number of children: Not on file  . Years of education: Not on file  . Highest education level: Not on file  Occupational History  . Not on file  Tobacco Use  . Smoking status: Never Smoker  . Smokeless tobacco: Never Used  Substance and Sexual Activity  . Alcohol use: Not on file  . Drug use: Not on file  . Sexual activity: Not on file   Other Topics Concern  . Not on file  Social History Narrative  . Not on file   Social Determinants of Health   Financial Resource Strain:   . Difficulty of Paying Living Expenses: Not on file  Food Insecurity:   . Worried About Charity fundraiser in the Last Year: Not on file  . Ran Out of Food in the Last Year: Not on file  Transportation Needs:   . Lack of Transportation (Medical): Not on file  . Lack of Transportation (Non-Medical): Not on file  Physical Activity:   . Days of Exercise per Week: Not on file  . Minutes of Exercise per Session: Not on file  Stress:   . Feeling of Stress : Not on file  Social Connections:   . Frequency of Communication with Friends and Family: Not on file  . Frequency of Social Gatherings with Friends and Family: Not on file  . Attends Religious Services: Not on file  . Active Member of Clubs or Organizations: Not on file  . Attends Archivist Meetings: Not on file  . Marital Status: Not on file  Intimate Partner Violence:   . Fear of Current or Ex-Partner: Not on file  . Emotionally Abused: Not on file  .  Physically Abused: Not on file  . Sexually Abused: Not on file     PHYSICAL EXAM:  VS: Ht 5' 7"  (1.702 m)  Physical Exam Gen: NAD, alert, cooperative with exam, well-appearing ENT: normal lips, normal nasal mucosa,  Eye: normal EOM, normal conjunctiva and lids Skin: no rashes, no areas of induration  Neuro: normal tone, normal sensation to touch Psych:  normal insight, alert and oriented MSK:  Left leg: No ecchymosis or swelling. Tenderness to palpation of the distal femur. Pain exacerbated with lower leg movement. Right knee: No obvious redness  Some tenderness to palpation over the patella. Neurovascularly intact  1. Leg above knee posteriorly to lower leg 2. Left 3. Type of splint, e.g. short or long 4.  Ortho-Glass 4 inch 4.  Applied by my self    ASSESSMENT & PLAN:   Closed fracture of distal end of  femur, unspecified fracture morphology, initial encounter ([place]) Previous splint was on for roughly 8 weeks.  Has callus formation on the x-ray but still painful with transferring. -Applied's leg splint today. -Counseled on supportive care. -Counseled on caretaker to evaluate for any skin breakdown  Acute pain of right knee Pain still occurring and can be severe at times.  The Pennsaid has help with the pain. -Continue Pennsaid. -Counseled on supportive care. -Can consider injection if no improvement.

## 2019-11-16 NOTE — Assessment & Plan Note (Signed)
Pain still occurring and can be severe at times.  The Pennsaid has help with the pain. -Continue Pennsaid. -Counseled on supportive care. -Can consider injection if no improvement.

## 2019-11-16 NOTE — Patient Instructions (Signed)
Good to see you Please try ice   Please send me a message in MyChart with any questions or updates.  Please see me back in 4 weeks.   --Dr. Raeford Razor
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
## 2019-11-19 NOTE — Progress Notes (Signed)
Pamela Fuller, Pamela Fuller ([id number]) Visit Report for 11/16/2019 Arrival Information Details Patient Name: Date of Service: Pamela Fuller, Pamela Fuller 11/16/2019 12:30 PM Medical Record [id number] Patient Account Number: [id number] Date of Birth/Sex: Treating RN: Mar 31, 1981 (39 y.o. Elam Dutch Primary Care Ab Leaming: Clovia Cuff Other Clinician: Referring Velvet Moomaw: Treating Kaelynne Christley/Extender:Robson, Celso Sickle, PHILIP Weeks in Treatment: 11 Visit Information History Since Last Visit Added or deleted any medications: No Patient Arrived: Wheel Chair Any new allergies or adverse reactions: No Arrival Time: 13:17 Had a fall or experienced change in No activities of daily living that may affect Accompanied By: caregiver risk of falls: Transfer Assistance: None Signs or symptoms of abuse/neglect since last No Patient Identification Verified: Yes visito Secondary Verification Process Completed: Yes Hospitalized since last visit: No Patient Requires Transmission-Based No Implantable device outside of the clinic excluding No Precautions: cellular tissue based products placed in the center Patient Has Alerts: No since last visit: Has Dressing in Place as Prescribed: Yes Pain Present Now: Yes Electronic Signature(s) Signed: 11/16/2019 6:12:59 PM By: Baruch Gouty RN, BSN Entered By: Baruch Gouty on 11/16/2019 13:18:37 -------------------------------------------------------------------------------- Encounter Discharge Information Details Patient Name: Date of Service: Pamela Fuller, Pamela Fuller 11/16/2019 12:30 PM Medical Record [id number] Patient Account Number: [id number] Date of Birth/Sex: Treating RN: 1980-11-30 (39 y.o. Clearnce Sorrel Primary Care Gianny Sabino: Clovia Cuff Other Clinician: Referring Sugar Vanzandt: Treating Jamaal Bernasconi/Extender:Robson, Celso Sickle, PHILIP Weeks in Treatment: 11 Encounter Discharge Information Items Post Procedure Vitals Discharge Condition:  Stable Temperature (F): 98.3 Ambulatory Status: Wheelchair Pulse (bpm): 59 Discharge Destination: Home Respiratory Rate (breaths/min): 18 Transportation: Other Blood Pressure (mmHg): 112/83 Accompanied By: caregiver Schedule Follow-up Appointment: Yes Clinical Summary of Care: Patient Declined Electronic Signature(s) Signed: 11/19/2019 5:57:08 PM By: Kela Millin Entered By: Kela Millin on 11/16/2019 18:18:41 -------------------------------------------------------------------------------- Lower Extremity Assessment Details Patient Name: Date of Service: Pamela Fuller, Pamela Fuller 11/16/2019 12:30 PM Medical Record [id number] Patient Account Number: [id number] Date of Birth/Sex: Treating RN: [birth year] (39 y.o. Elam Dutch Primary Care Elbony Mcclimans: Clovia Cuff Other Clinician: Referring Kinisha Soper: Treating Abrahm Mancia/Extender:Robson, Celso Sickle, PHILIP Weeks in Treatment: 11 Electronic Signature(s) Signed: 11/16/2019 6:12:59 PM By: Baruch Gouty RN, BSN Entered By: Baruch Gouty on 11/16/2019 13:20:14 -------------------------------------------------------------------------------- Multi Wound Chart Details Patient Name: Date of Service: Pamela Fuller, Pamela Fuller 11/16/2019 12:30 PM Medical Record [id number] Patient Account Number: [id number] Date of Birth/Sex: Treating RN: Oct 02, 1981 (39 y.o. F) Primary Care Diamond Martucci: Clovia Cuff Other Clinician: Referring Rebacca Votaw: Treating Rashi Giuliani/Extender:Robson, Celso Sickle, PHILIP Weeks in Treatment: 11 Vital Signs Height(in): 67 Pulse(bpm): Weight(lbs): 145 Blood Pressure(mmHg): Body Mass Index(BMI): 23 Temperature(F): 98.3 Respiratory 18 Rate(breaths/min): Photos: [1:No Photos] [2:No Photos] [N/A:N/A] Wound Location: [1:Sacrum] [2:Right Gluteus] [N/A:N/A] Wounding Event: [1:Pressure Injury] [2:Pressure Injury] [N/A:N/A] Primary Etiology: [1:Pressure Ulcer] [2:Pressure Ulcer] [N/A:N/A] Comorbid History:  [1:Aspiration, Crohns, Rheumatoid Arthritis, Quadriplegia, Paraplegia] [2:Aspiration, Crohns, Rheumatoid Arthritis, Quadriplegia, Paraplegia] [N/A:N/A] Date Acquired: [1:05/21/2019] [2:05/21/2019] [N/A:N/A] Weeks of Treatment: [1:11] [2:11] [N/A:N/A] Wound Status: [1:Open] [2:Open] [N/A:N/A] Measurements L x W x D 4.5x2.5x1.7 [2:0x0x0] [N/A:N/A] (cm) Area (cm) : [1:8.836] [2:0] [N/A:N/A] Volume (cm) : [1:15.021] [2:0] [N/A:N/A] % Reduction in Area: [1:-524.90%] [2:100.00%] [N/A:N/A] % Reduction in Volume: -3442.70% [2:100.00%] [N/A:N/A] Classification: [1:Category/Stage III] [2:Category/Stage III] [N/A:N/A] Exudate Amount: [1:Medium] [2:None Present] [N/A:N/A] Exudate Type: [1:Serosanguineous] [2:N/A] [N/A:N/A] Exudate Color: [1:red, brown] [2:N/A] [N/A:N/A] Wound Margin: [1:Flat and Intact] [2:Flat and Intact] [N/A:N/A] Granulation Amount: [1:Medium (34-66%)] [2:None Present (0%)] [N/A:N/A] Granulation Quality: [1:Red, Friable] [2:N/A] [N/A:N/A] Necrotic Amount: [1:Medium (34-66%)] [2:None Present (0%)] [N/A:N/A] Exposed Structures: [1:Fat Layer (Subcutaneous Tissue) Exposed: Yes Fascia: No Tendon: No Muscle: No Joint: No Bone:  No] [2:Fascia: No Fat Layer (Subcutaneous Tissue) Exposed: No Tendon: No Muscle: No Joint: No Bone: No] [N/A:N/A] Epithelialization: [1:None] [2:Large (67-100%)] [N/A:N/A] Debridement: [1:Debridement - Excisional] [2:N/A] [N/A:N/A] Pre-procedure [1:14:04] [2:N/A] [N/A:N/A] Verification/Time Out Taken: Pain Control: [1:Lidocaine 5% topical ointment] [2:N/A] [N/A:N/A] Tissue Debrided: [1:Subcutaneous, Slough] [2:N/A] [N/A:N/A] Level: [1:Skin/Subcutaneous Tissue] [2:N/A] [N/A:N/A] Debridement Area (sq cm):11.25 [2:N/A] [N/A:N/A] Instrument: [1:Curette] [2:N/A] [N/A:N/A] Bleeding: [1:Minimum] [2:N/A] [N/A:N/A] Hemostasis Achieved: [1:Pressure] [2:N/A] [N/A:N/A] Procedural Pain: [1:0] [2:N/A] [N/A:N/A] Post Procedural Pain: [1:0] [2:N/A] [N/A:N/A] Debridement  Treatment Procedure was tolerated [2:N/A] [N/A:N/A] Response: [1:well] Post Debridement [1:4.5x2.5x1.7] [2:N/A] [N/A:N/A] Measurements L x W x D (cm) Post Debridement [1:15.021] [2:N/A] [N/A:N/A] Volume: (cm) Post Debridement Stage: Category/Stage III [2:N/A N/A] [N/A:N/A N/A] Treatment Notes Electronic Signature(s) Signed: 11/16/2019 6:16:53 PM By: Linton Ham MD Entered By: Linton Ham on 11/16/2019 14:10:59 -------------------------------------------------------------------------------- Multi-Disciplinary Care Plan Details Patient Name: Date of Service: Pamela Fuller, Pamela Fuller 11/16/2019 12:30 PM Medical Record [id number] Patient Account Number: [id number] Date of Birth/Sex: Treating RN: September 12, 1981 (39 y.o. Orvan Falconer Primary Care Ason Heslin: Clovia Cuff Other Clinician: Referring Jonel Sick: Treating Alaira Level/Extender:Robson, Celso Sickle, PHILIP Weeks in Treatment: 11 Active Inactive Wound/Skin Impairment Nursing Diagnoses: Impaired tissue integrity Knowledge deficit related to ulceration/compromised skin integrity Goals: Patient/caregiver will verbalize understanding of skin care regimen Target Resolution Date Initiated: 08/27/2019 Date Inactivated: 11/16/2019 Date: 10/29/2019 Goal Status: Met Ulcer/skin breakdown will have a volume reduction of 30% by week 4 Date Initiated: 08/27/2019 Date Inactivated: 10/05/2019 Target11/20/2020 Resolution Date: Goal Status: Unmet Unmet Reason: comorbities Ulcer/skin breakdown will have a volume reduction of 50% by week 8 Date Inactivated: 11/16/2019 Target12/25/2020 Resolution Date Initiated: 10/05/2019 Date: Goal Status: Unmet Unmet Reason: comrobitites Ulcer/skin breakdown will have a volume reduction of 80% by week 12 Date Initiated: 11/16/2019 Target Resolution Date: 12/03/2019 Goal Status: Active Interventions: Assess patient/caregiver ability to obtain necessary supplies Assess patient/caregiver ability to  perform ulcer/skin care regimen upon admission and as needed Assess ulceration(s) every visit Provide education on ulcer and skin care Notes: Electronic Signature(s) Signed: 11/16/2019 6:18:05 PM By: Carlene Coria RN Entered By: Carlene Coria on 11/16/2019 13:44:47 -------------------------------------------------------------------------------- Pain Assessment Details Patient Name: Date of Service: Pamela Fuller, Pamela Fuller 11/16/2019 12:30 PM Medical Record [id number] Patient Account Number: [id number] Date of Birth/Sex: Treating RN: 1981/02/09 (39 y.o. Elam Dutch Primary Care Daeton Kluth: Clovia Cuff Other Clinician: Referring Aditri Louischarles: Treating Mindy Gali/Extender:Robson, Celso Sickle, PHILIP Weeks in Treatment: 11 Active Problems Location of Pain Severity and Description of Pain Patient Has Paino Yes Site Locations Pain Location: Generalized Pain With Dressing Change: Yes Duration of the Pain. Constant / Intermittento Intermittent Rate the pain. Current Pain Level: 8 Character of Pain Describe the Pain: Aching Pain Management and Medication Current Pain Management: Other: reposition Is the Current Pain Management Adequate: Adequate How does your wound impact your activities of daily livingo Sleep: Yes Bathing: No Appetite: No Relationship With Others: No Bladder Continence: No Emotions: No Bowel Continence: No Work: No Toileting: No Drive: No Dressing: No Hobbies: No Electronic Signature(s) Signed: 11/16/2019 6:12:59 PM By: Baruch Gouty RN, BSN Entered By: Baruch Gouty on 11/16/2019 13:20:07 -------------------------------------------------------------------------------- Patient/Caregiver Education Details Patient Name: Date of Service: Pamela Fuller, Pamela Fuller 1/12/2021andnbsp12:30 PM Medical Record [id number] Patient Account Number: [id number] Date of Birth/Gender: Treating RN: 1981/01/17 (39 y.o. Orvan Falconer Primary Care Physician: Clovia Cuff Other Clinician: Referring Physician: Treating Physician/Extender:Robson, Celso Sickle, PHILIP Weeks in Treatment: 11 Education Assessment Education Provided To: Patient Education Topics Provided Wound/Skin Impairment: Methods: Explain/Verbal Responses: State content correctly Electronic Signature(s) Signed: 11/16/2019 6:18:05 PM By:  Carlene Coria RN Entered By: Carlene Coria on 11/16/2019 13:45:00 -------------------------------------------------------------------------------- Wound Assessment Details Patient Name: Date of Service: Pamela Fuller, Pamela Fuller 11/16/2019 12:30 PM Medical Record [id number] Patient Account Number: [id number] Date of Birth/Sex: Treating RN: 1981/04/05 (39 y.o. Elam Dutch Primary Care Oreste Majeed: Clovia Cuff Other Clinician: Referring Zharia Conrow: Treating Megumi Treaster/Extender:Robson, Celso Sickle, PHILIP Weeks in Treatment: 11 Wound Status Wound Number: 1 Primary Pressure Ulcer Etiology: Wound Location: Sacrum Wound Open Wounding Event: Pressure Injury Status: Date Acquired: 05/21/2019 Comorbid Aspiration, Crohns, Rheumatoid Arthritis, Weeks Of Treatment: 11 History: Quadriplegia, Paraplegia Clustered Wound: No Photos Wound Measurements Length: (cm) 4.5 % Reductio Width: (cm) 2.5 % Reductio Depth: (cm) 1.7 Epithelial Area: (cm) 8.836 Tunneling Volume: (cm) 15.021 Undermini Wound Description Classification: Category/Stage III Wound Margin: Flat and Intact Exudate Amount: Medium Exudate Type: Serosanguineous Exudate Color: red, brown Wound Bed Granulation Amount: Medium (34-66%) Granulation Quality: Red, Friable Necrotic Amount: Medium (34-66%) Necrotic Quality: Adherent Slough Foul Odor After Cleansing: No Slough/Fibrino Yes Exposed Structure Fascia Exposed: No Fat Layer (Subcutaneous Tissue) Exposed: Yes Tendon Exposed: No Muscle Exposed: No Joint Exposed: No Bone Exposed: No n in Area: -524.9% n in Volume:  -3442.7% ization: None : No ng: No Treatment Notes Wound #1 (Sacrum) 1. Cleanse With Wound Cleanser 2. Periwound Care Skin Prep 3. Primary Dressing Applied Calcium Alginate Ag 4. Secondary Dressing Dry Gauze Foam Electronic Signature(s) Signed: 11/17/2019 3:30:11 PM By: Mikeal Hawthorne EMT/HBOT Signed: 11/17/2019 6:34:17 PM By: Baruch Gouty RN, BSN Previous Signature: 11/16/2019 6:12:59 PM Version By: Baruch Gouty RN, BSN Entered By: Mikeal Hawthorne on 11/17/2019 14:46:08 -------------------------------------------------------------------------------- Wound Assessment Details Patient Name: Date of Service: Pamela Fuller, Pamela Fuller 11/16/2019 12:30 PM Medical Record [id number] Patient Account Number: [id number] Date of Birth/Sex: Treating RN: [birth year] (39 y.o. Elam Dutch Primary Care Glenice Ciccone: Clovia Cuff Other Clinician: Referring Valmai Vandenberghe: Treating Josh Nicolosi/Extender:Robson, Celso Sickle, PHILIP Weeks in Treatment: 11 Wound Status Wound Number: 2 Primary Pressure Ulcer Etiology: Wound Location: Right Gluteus Wound Healed - Epithelialized Wounding Event: Pressure Injury Status: Date Acquired: 05/21/2019 Comorbid Aspiration, Crohns, Rheumatoid Arthritis, Weeks Of Treatment: 11 History: Quadriplegia, Paraplegia Clustered Wound: No Photos Wound Measurements Length: (cm) 0 % Reductio Width: (cm) 0 % Reductio Depth: (cm) 0 Epithelial Area: (cm) 0 Tunneling Volume: (cm) 0 Undermini Wound Description Classification: Category/Stage III Wound Margin: Flat and Intact Exudate Amount: None Present Wound Bed Granulation Amount: None Present (0%) Necrotic Amount: None Present (0%) Foul Odor After Cleansing: No Slough/Fibrino No Exposed Structure Fascia Exposed: No Fat Layer (Subcutaneous Tissue) Exposed: No Tendon Exposed: No Muscle Exposed: No Joint Exposed: No Bone Exposed: No n in Area: 100% n in Volume: 100% ization: Large (67-100%) : No ng:  No Electronic Signature(s) Signed: 11/17/2019 3:30:11 PM By: Mikeal Hawthorne EMT/HBOT Signed: 11/17/2019 6:34:17 PM By: Baruch Gouty RN, BSN Previous Signature: 11/16/2019 6:12:59 PM Version By: Baruch Gouty RN, BSN Entered By: Mikeal Hawthorne on 11/17/2019 14:46:30 -------------------------------------------------------------------------------- Vitals Details Patient Name: Date of Service: Pamela Fuller, Pamela Fuller 11/16/2019 12:30 PM Medical Record [id number] Patient Account Number: [id number] Date of Birth/Sex: Treating RN: March 04, 1981 (39 y.o. Elam Dutch Primary Care Lemont Sitzmann: Clovia Cuff Other Clinician: Referring Kayona Foor: Treating Shylo Zamor/Extender:Robson, Celso Sickle, PHILIP Weeks in Treatment: 11 Vital Signs Time Taken: 13:18 Temperature (F): 98.3 Height (in): 67 Pulse (bpm): 59 Source: Stated Respiratory Rate (breaths/min): 18 Weight (lbs): 145 Blood Pressure (mmHg): 112/83 Source: Stated Reference Range: 80 - 120 mg / dl Body Mass Index (BMI): 22.7 Electronic Signature(s) Signed: 11/19/2019 5:57:08 PM By: Kela Millin Previous Signature: 11/16/2019 6:12:59 PM Version  By: Baruch Gouty RN, BSN Entered By: Kela Millin on 11/16/2019 18:16:59

## 2019-11-24 ENCOUNTER — Encounter (HOSPITAL_COMMUNITY): Payer: Self-pay | Admitting: Emergency Medicine

## 2019-11-24 ENCOUNTER — Other Ambulatory Visit: Payer: Self-pay

## 2019-11-24 ENCOUNTER — Emergency Department (HOSPITAL_COMMUNITY)
Admission: EM | Admit: 2019-11-24 | Discharge: 2019-11-25 | Disposition: A | Discharge status: Home / Self Care | Payer: Medicaid Other | Attending: Emergency Medicine | Admitting: Emergency Medicine

## 2019-11-24 ENCOUNTER — Emergency Department (HOSPITAL_COMMUNITY): Payer: Medicaid Other

## 2019-11-24 DIAGNOSIS — N3000 Acute cystitis without hematuria: Secondary | ICD-10-CM

## 2019-11-24 DIAGNOSIS — R109 Unspecified abdominal pain: Secondary | ICD-10-CM

## 2019-11-24 DIAGNOSIS — R067 Sneezing: Secondary | ICD-10-CM | POA: Insufficient documentation

## 2019-11-24 DIAGNOSIS — Z79899 Other long term (current) drug therapy: Secondary | ICD-10-CM | POA: Diagnosis not present

## 2019-11-24 DIAGNOSIS — R1032 Left lower quadrant pain: Secondary | ICD-10-CM | POA: Diagnosis present

## 2019-11-24 DIAGNOSIS — Z20822 Contact with and (suspected) exposure to covid-19: Secondary | ICD-10-CM | POA: Insufficient documentation

## 2019-11-24 LAB — I-STAT CHEM 8, ED
BUN: 7 mg/dL (ref 6–20)
Calcium, Ion: 0.98 mmol/L — ABNORMAL LOW (ref 1.15–1.40)
Chloride: 110 mmol/L (ref 98–111)
Creatinine, Ser: <0.2 mg/dL — ABNORMAL LOW (ref 0.44–1.00)
Glucose, Bld: 83 mg/dL (ref 70–99)
HCT: 36 % (ref 36.0–46.0)
Hemoglobin: 12.2 g/dL (ref 12.0–15.0)
Potassium: 3.8 mmol/L (ref 3.5–5.1)
Sodium: 142 mmol/L (ref 135–145)
TCO2: 24 mmol/L (ref 22–32)

## 2019-11-24 LAB — I-STAT BETA HCG BLOOD, ED (MC, WL, AP ONLY): I-stat hCG, quantitative: <5 m[IU]/mL (ref <5)

## 2019-11-24 LAB — COMPREHENSIVE METABOLIC PANEL
ALT: 9 U/L (ref 0–44)
AST: 12 U/L — ABNORMAL LOW (ref 15–41)
Albumin: 2.7 g/dL — ABNORMAL LOW (ref 3.5–5.0)
Alkaline Phosphatase: 88 U/L (ref 38–126)
Anion gap: 12 (ref 5–15)
BUN: 7 mg/dL (ref 6–20)
CO2: 23 mmol/L (ref 22–32)
Calcium: 8.4 mg/dL — ABNORMAL LOW (ref 8.9–10.3)
Chloride: 106 mmol/L (ref 98–111)
Creatinine, Ser: 0.32 mg/dL — ABNORMAL LOW (ref 0.44–1.00)
GFR calc Af Amer: >60 mL/min (ref >60)
GFR calc non Af Amer: >60 mL/min (ref >60)
Glucose, Bld: 89 mg/dL (ref 70–99)
Potassium: 3.3 mmol/L — ABNORMAL LOW (ref 3.5–5.1)
Sodium: 141 mmol/L (ref 135–145)
Total Bilirubin: 0.5 mg/dL (ref 0.3–1.2)
Total Protein: 7.3 g/dL (ref 6.5–8.1)

## 2019-11-24 LAB — CBC
HCT: 39.9 % (ref 36.0–46.0)
Hemoglobin: 12.2 g/dL (ref 12.0–15.0)
MCH: 27.2 pg (ref 26.0–34.0)
MCHC: 30.6 g/dL (ref 30.0–36.0)
MCV: 88.9 fL (ref 80.0–100.0)
Platelets: 302 10*3/uL (ref 150–400)
RBC: 4.49 MIL/uL (ref 3.87–5.11)
RDW: 15.7 % — ABNORMAL HIGH (ref 11.5–15.5)
WBC: 7.9 10*3/uL (ref 4.0–10.5)
nRBC: 0 % (ref 0.0–0.2)

## 2019-11-24 LAB — URINALYSIS, ROUTINE W REFLEX MICROSCOPIC
Bilirubin Urine: NEGATIVE
Glucose, UA: NEGATIVE mg/dL
Hgb urine dipstick: NEGATIVE
Ketones, ur: 80 mg/dL — AB
Nitrite: NEGATIVE
Protein, ur: 30 mg/dL — AB
Specific Gravity, Urine: 1.021 (ref 1.005–1.030)
WBC, UA: >50 WBC/hpf — ABNORMAL HIGH (ref 0–5)
pH: 6 (ref 5.0–8.0)

## 2019-11-24 LAB — LIPASE, BLOOD: Lipase: 18 U/L (ref 11–51)

## 2019-11-24 MED ORDER — POTASSIUM CHLORIDE CRYS ER 20 MEQ PO TBCR
40.0000 meq | EXTENDED_RELEASE_TABLET | Freq: Once | ORAL | Status: DC
  Filled 2019-11-24: qty 2

## 2019-11-24 MED ORDER — SODIUM CHLORIDE 0.9% FLUSH
3.0000 mL | Freq: Once | INTRAVENOUS | Status: AC
  Administered 2019-11-24: 17:00:00 3 mL via INTRAVENOUS

## 2019-11-24 MED ORDER — CEPHALEXIN 500 MG PO CAPS: 500.0000 mg | ORAL_CAPSULE | Freq: Four times a day (QID) | ORAL | 0 refills | Status: AC

## 2019-11-24 MED ORDER — TRAMADOL HCL 50 MG PO TABS
50.0000 mg | ORAL_TABLET | Freq: Once | ORAL | Status: AC
  Administered 2019-11-24: 50 mg via ORAL
  Filled 2019-11-24: qty 1

## 2019-11-24 MED ORDER — IOHEXOL 300 MG/ML  SOLN
100.0000 mL | Freq: Once | INTRAMUSCULAR | Status: AC | PRN
  Administered 2019-11-24: 100 mL via INTRAVENOUS

## 2019-11-24 NOTE — ED Triage Notes (Signed)
Pt to triage via [facility] from home.  Diagnosed with UTI a few days ago and is taking Cipro.  Home health staff called EMS out x 2 and was said that pt needed to come to ED for the UTI.  Pt reports abd pain at her colostomy site.  Contact- [name] [phone].

## 2019-11-24 NOTE — ED Notes (Signed)
[facility] called for transport.  

## 2019-11-24 NOTE — Discharge Instructions (Signed)
Stop taking ciprofloxacin. You were given a prescription for antibiotics. Please take the antibiotic prescription fully.   Please follow up with your primary care provider within 5-7 days for re-evaluation of your symptoms. If you do not have a primary care provider, information for a healthcare clinic has been provided for you to make arrangements for follow up care. Please return to the emergency department for any new or worsening symptoms.

## 2019-11-24 NOTE — ED Provider Notes (Signed)
[facility] Provider Note   CSN: [id number] Arrival date & time: 11/24/19  1430     History Chief Complaint  Patient presents with  . Abdominal Pain    Pamela Fuller is a 39 y.o. female.  HPI   Pt is a 39 y/o female with a h/o cerebral palsy, HTN, chronic suprapubic catheter, functional quadriplegia, PTSD, schizoaffective disorder who presents to the ED today for abd of abd pain.  She complains of lower abdominal pain that has been constant for the last 3 days.  States that she was diagnosed with a UTI few days ago and has been on an antibiotic but she is not sure which one.  She feels like it is not working because she is still having pain.  She states that this pain feels similar to when she has had UTIs in the past.  She states she thinks she has a fever.  She has had no vomiting, nausea, diarrhea or constipation.  She denies any pain at her colostomy site or at the site of the suprapubic catheter.  She reports that she has been sneezing recently.  She denies any rhinorrhea, congestion, sore throat, cough, chest pain or shortness of breath.  She denies that she has any known Covid exposures.  3:51 PM Discussed case with [name] who thinks that patient is on Cipro.  States that patient has been out of her pain pills for several days and thinks this may also be contributing to her symptoms.   Past Medical History:  Diagnosis Date  . Cerebral palsy [facility])     Patient Active Problem List   Diagnosis Date Noted  . Acute pain of right knee 11/11/2019  . Closed fracture of distal end of femur, unspecified fracture morphology, initial encounter ([place]) 11/11/2019  . Iron deficiency anemia 09/01/2019  . Benign essential hypertension 08/31/2019  . Cerebral palsy ([place]) 08/31/2019  . Chronic suprapubic catheter ([place]) 08/31/2019  . Functional quadriplegia ([place]) 08/31/2019  . PTSD (post-traumatic stress disorder) 08/31/2019  . Sacral decubitus  ulcer, stage IV ([place]) 08/31/2019  . Schizoaffective disorder ([place]) 08/31/2019    History reviewed. No pertinent surgical history.   OB History   No obstetric history on file.     No family history on file.  Social History   Tobacco Use  . Smoking status: Never Smoker  . Smokeless tobacco: Never Used  Substance Use Topics  . Alcohol use: Not on file  . Drug use: Not on file    Home Medications Prior to Admission medications   Medication Sig Start Date End Date Taking? Authorizing Provider  baclofen (LIORESAL) 10 MG tablet Take 1.5 tablets (15 mg total) by mouth 3 (three) times daily. 10/03/19   Robinson, [place] N, PA-C  cephALEXin (KEFLEX) 500 MG capsule Take 1 capsule (500 mg total) by mouth 4 (four) times daily for 10 days. 11/24/19 12/04/19  Jadarius Commons S, PA-C  dorzolamide-timolol (COSOPT) 22.3-6.8 MG/ML ophthalmic solution Place 1 drop into both eyes 2 (two) times daily.    [provider]  latanoprost (XALATAN) 0.005 % ophthalmic solution Place 1 drop into both eyes at bedtime.    [provider]  Polyethylene Glycol 3350 (PEG 3350) 17 GM/SCOOP POWD Take 17 g by mouth every other day.    [provider]  sertraline (ZOLOFT) 50 MG tablet Take 50 mg by mouth daily.    [provider]  traMADol (ULTRAM) 50 MG tablet Take 1 tablet (50 mg total)  by mouth every 8 (eight) hours as needed. Patient taking differently: Take 50 mg by mouth every 8 (eight) hours as needed for moderate pain.  10/03/19   Robinson, [place] N, PA-C    Allergies    Levaquin [levofloxacin], Codeine, Docusate, Quinolones, and Sulfa antibiotics  Review of Systems   Review of Systems  Constitutional: Negative for fever.  HENT: Positive for sneezing. Negative for ear pain and sore throat.   Eyes: Negative for visual disturbance.  Respiratory: Negative for cough and shortness of breath.   Cardiovascular: Negative for chest pain.  Gastrointestinal: Positive for  abdominal pain. Negative for constipation, diarrhea, nausea and vomiting.  Genitourinary: Negative for hematuria.       H/o suprapubic catheter  Musculoskeletal: Negative for back pain.  Skin: Negative for rash.  Neurological: Negative for headaches.  All other systems reviewed and are negative.   Physical Exam Updated Vital Signs BP 128/69 (BP Location: Right Arm)   Pulse 83   Temp 98.3 F (36.8 C)   Resp 12   LMP 11/03/2019   SpO2 100%   Physical Exam Vitals and nursing note reviewed.  Constitutional:      General: She is not in acute distress.    Appearance: She is well-developed.  HENT:     Head: Normocephalic and atraumatic.  Eyes:     Conjunctiva/sclera: Conjunctivae normal.  Cardiovascular:     Rate and Rhythm: Normal rate and regular rhythm.     Heart sounds: No murmur.  Pulmonary:     Effort: Pulmonary effort is normal. No respiratory distress.     Breath sounds: Normal breath sounds.  Abdominal:     General: Bowel sounds are normal.     Palpations: Abdomen is soft.     Tenderness: There is abdominal tenderness (mild bilat lower abd ttp). There is no guarding or rebound.     Comments: No TTP to the colostomy, no TTP around suprapubic catheter site.   Musculoskeletal:     Cervical back: Neck supple.  Skin:    General: Skin is warm and dry.  Neurological:     Mental Status: She is alert.     ED Results / Procedures / Treatments   Labs (all labs ordered are listed, but only abnormal results are displayed) Labs Reviewed  COMPREHENSIVE METABOLIC PANEL - Abnormal; Notable for the following components:      Result Value   Potassium 3.3 (*)    Creatinine, Ser 0.32 (*)    Calcium 8.4 (*)    Albumin 2.7 (*)    AST 12 (*)    All other components within normal limits  CBC - Abnormal; Notable for the following components:   RDW 15.7 (*)    All other components within normal limits  URINALYSIS, ROUTINE W REFLEX MICROSCOPIC - Abnormal; Notable for the  following components:   APPearance TURBID (*)    Ketones, ur 80 (*)    Protein, ur 30 (*)    Leukocytes,Ua LARGE (*)    WBC, UA >50 (*)    Bacteria, UA RARE (*)    All other components within normal limits  I-STAT CHEM 8, ED - Abnormal; Notable for the following components:   Creatinine, Ser <0.20 (*)    Calcium, Ion 0.98 (*)    All other components within normal limits  URINE CULTURE  NOVEL CORONAVIRUS, NAA (HOSP ORDER, SEND-OUT TO REF LAB; TAT 18-24 HRS)  LIPASE, BLOOD  I-STAT BETA HCG BLOOD, ED ([facility], [facility], [facility] ONLY)  EKG None  Radiology CT ABDOMEN PELVIS W CONTRAST  Result Date: 11/24/2019 CLINICAL DATA:  Lower abdominal pain. EXAM: CT ABDOMEN AND PELVIS WITH CONTRAST TECHNIQUE: Multidetector CT imaging of the abdomen and pelvis was performed using the standard protocol following bolus administration of intravenous contrast. CONTRAST:  163m OMNIPAQUE IOHEXOL 300 MG/ML  SOLN COMPARISON:  October 03, 2019 FINDINGS: Lower chest: No acute abnormality. Hepatobiliary: No focal liver abnormality is seen. No gallstones, gallbladder wall thickening, or biliary dilatation. Pancreas: Unremarkable. No pancreatic ductal dilatation or surrounding inflammatory changes. Spleen: Normal in size without focal abnormality. Adrenals/Urinary Tract: Adrenal glands are unremarkable. Kidneys are normal, without renal calculi, focal lesion, or hydronephrosis. A suprapubic catheter is seen within the urinary bladder. Multiple urinary bladder calculi are again seen. Stomach/Bowel: A left lower quadrant ostomy site is seen. This is present on the prior study. Stomach is within normal limits. Appendix appears normal. No evidence of bowel wall thickening, distention, or inflammatory changes. Vascular/Lymphatic: No significant vascular findings are present. No enlarged abdominal or pelvic lymph nodes. Reproductive: Uterine fundus is mildly enlarged and lobulated in appearance. This is unchanged in appearance when  compared to the prior study. Other: No abdominal wall hernia or abnormality. No abdominopelvic ascites. Musculoskeletal: A stable appearing soft tissue defect is seen adjacent to the posterior aspect of the lower sacrum. Chronic appearing changes seen involving the adjacent portion of the sacrum. There is marked severity dextroscoliosis of the thoracolumbar spine. Bilateral Harrington rods are seen along the length of the lumbar spine. Involvement of the visualized portion of the lower thoracic spine and superior aspect of the sacrum is noted. There is associated streak artifact and subsequently limited evaluation of the adjacent osseous and soft tissue structures. IMPRESSION: 1. Stable postoperative changes within the abdomen with subsequent left lower quadrant ostomy site. 2. Stable extensive changes throughout the thoracolumbar spine. 3. Stable sacral decubitus ulcer with adjacent findings consistent with chronic osteomyelitis of the sacrum. 4. Findings likely consistent with enlarged, noncalcified uterine fibroids. Electronically Signed   By: TVirgina NorfolkM.D.   On: 11/24/2019 21:17    Procedures Procedures (including critical care time)  Medications Ordered in ED Medications  potassium chloride SA (KLOR-CON) CR tablet 40 mEq (40 mEq Oral Refused 11/24/19 1928)  sodium chloride flush (NS) 0.9 % injection 3 mL (3 mLs Intravenous Given 11/24/19 1641)  traMADol (ULTRAM) tablet 50 mg (50 mg Oral Given 11/24/19 1605)  iohexol (OMNIPAQUE) 300 MG/ML solution 100 mL (100 mLs Intravenous Contrast Given 11/24/19 2105)    ED Course  I have reviewed the triage vital signs and the nursing notes.  Pertinent labs & imaging results that were available during my care of the patient were reviewed by me and considered in my medical decision making (see chart for details).    MDM Rules/Calculators/A&P                      39year old female presenting for evaluation of lower abdominal pain.  Does have  history of cerebral palsy with suprapubic catheter in place.  She also states she has been sneezing but does not have any other upper respiratory symptoms at this time.  On evaluation patient afebrile, normal vital signs.  She does have some tenderness to the lower abdomen without any guarding.  There is no erythema or tenderness to the suprapubic catheter site or to her colostomy site.  The remainder of her exam is within normal limits.  We will obtain labs, give pain  medication, obtain imaging, and check for Covid  CBC is without leukocytosis CMP mild hypokalemia which is supplemented in the ED.  Normal kidney function.  Normal liver enzymes. Lipase negative Beta hcg negative UA with large leukocytes, 0-5 RBCs, greater than 50 WBCs and rare bacteria.  Suggestive of partially treated UTI.  Culture was sent and patient will be switched to Keflex from Cipro since her symptoms have not improved since onset. Covid testing obtained due to patient's report of persistent sneezing.  CT abd/pelvis is negative for any acute abdominal/pelvic process at this time.  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  Pt given medication, and her pain improved.  I suspect symptoms are related to her UTI.  Reviewed prior culture reports in epic.  We will switch from Cipro to Keflex.  Discussed the findings of work-up and plan for medication changes.  Have advised her to follow-up with a urologist in regards to this.  She states she does not have a PCP so I will give her information for the [facility].  Advised her to call and schedule appointment for follow-up and establish care.  Advised on return precautions.  She voiced understanding of plan and reasons to return.  Questions answered.  Patient still for discharge.  ----   Pamela Fuller was evaluated in Emergency Department on 11/24/2019 for the symptoms described in the history of present illness. She was  evaluated in the context of the global COVID-19 pandemic, which necessitated consideration that the patient might be at risk for infection with the SARS-CoV-2 virus that causes COVID-19. Institutional protocols and algorithms that pertain to the evaluation of patients at risk for COVID-19 are in a state of rapid change based on information released by regulatory bodies including the CDC and federal and state organizations. These policies and algorithms were followed during the patient's care in the ED.     Final Clinical Impression(s) / ED Diagnoses Final diagnoses:  Abdominal pain, unspecified abdominal location  Acute cystitis without hematuria    Rx / DC Orders ED Discharge Orders         Ordered    cephALEXin (KEFLEX) 500 MG capsule  4 times daily     11/24/19 2155           Bishop Dublin 11/24/19 2156    Virgel Manifold, MD 11/25/19 1505

## 2019-11-25 LAB — NOVEL CORONAVIRUS, NAA (HOSP ORDER, SEND-OUT TO REF LAB; TAT 18-24 HRS): SARS-CoV-2, NAA: NOT DETECTED

## 2019-11-25 NOTE — ED Notes (Signed)
Home with ptar

## 2019-11-26 LAB — URINE CULTURE: Culture: NO GROWTH

## 2019-11-30 ENCOUNTER — Encounter (HOSPITAL_BASED_OUTPATIENT_CLINIC_OR_DEPARTMENT_OTHER): Payer: Medicaid Other | Attending: Internal Medicine | Admitting: Internal Medicine

## 2019-11-30 ENCOUNTER — Other Ambulatory Visit: Payer: Self-pay

## 2019-11-30 DIAGNOSIS — L89154 Pressure ulcer of sacral region, stage 4: Secondary | ICD-10-CM | POA: Diagnosis not present
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
## 2019-12-02 NOTE — Progress Notes (Addendum)
Pamela, Fuller ([id number]) Visit Report for 11/30/2019 Arrival Information Details Patient Name: Date of Service: Pamela Fuller, Pamela Fuller 11/30/2019 3:15 PM Medical Record [id number] Patient Account Number: [id number] Date of Birth/Sex: Treating RN: 1981-04-30 (39 y.o. Female) Baruch Gouty Primary Care Stassi Fadely: Clovia Cuff Other Clinician: Referring Melane Windholz: Treating Cheston Coury/Extender:Robson, Celso Sickle, PHILIP Weeks in Treatment: 13 Visit Information History Since Last Visit Added or deleted any medications: Yes Patient Arrived: Wheel Chair Any new allergies or adverse reactions: No Arrival Time: 15:39 Had a fall or experienced change in No activities of daily living that may affect Accompanied By: caregiver risk of falls: Transfer Assistance: [employer] fast streamer Signs or symptoms of abuse/neglect since last No Patient Identification Verified: Yes visito Secondary Verification Process Completed: Yes Hospitalized since last visit: Yes Patient Requires Transmission-Based No Implantable device outside of the clinic excluding No Precautions: cellular tissue based products placed in the center Patient Has Alerts: No since last visit: Has Dressing in Place as Prescribed: Yes Pain Present Now: Yes Electronic Signature(s) Signed: 11/30/2019 6:19:30 PM By: Baruch Gouty RN, BSN Entered By: Baruch Gouty on 11/30/2019 15:41:59 -------------------------------------------------------------------------------- Complex / Palliative Patient Assessment Details Patient Name: Date of Service: Pamela Fuller, Pamela Fuller 11/30/2019 3:15 PM Medical Record [id number] Patient Account Number: [id number] Date of Birth/Sex: Treating RN: 1981-05-19 (39 y.o. Female) Levan Hurst Primary Care Anylah Scheib: Clovia Cuff Other Clinician: Referring Denelda Akerley: Treating Devontae Casasola/Extender:Robson, Celso Sickle, PHILIP Weeks in Treatment: 13 Palliative Management Criteria Complex Wound Management  Criteria Patient has remarkable or complex co-morbidities requiring medications or treatments that extend wound healing times. Examples: Diabetes mellitus with chronic renal failure or end stage renal disease requiring dialysis Advanced or poorly controlled rheumatoid arthritis Diabetes mellitus and end stage chronic obstructive pulmonary disease Active cancer with current chemo- or radiation therapy Cerebral Palsy, Quadraplegia Care Approach Wound Care Plan: Complex Wound Management Electronic Signature(s) Signed: 12/03/2019 5:55:42 PM By: Linton Ham MD Signed: 12/06/2019 6:39:44 PM By: Levan Hurst RN, BSN Entered By: Levan Hurst on 12/03/2019 09:57:43 -------------------------------------------------------------------------------- Encounter Discharge Information Details Patient Name: Date of Service: Pamela, Fuller 11/30/2019 3:15 PM Medical Record [id number] Patient Account Number: [id number] Date of Birth/Sex: Treating RN: [birth year] (39 y.o. Female) Kela Millin Primary Care Kourtnei Rauber: Clovia Cuff Other Clinician: Referring Lile Mccurley: Treating Tove Wideman/Extender:Robson, Celso Sickle, PHILIP Weeks in Treatment: 13 Encounter Discharge Information Items Post Procedure Vitals Discharge Condition: Stable Temperature (F): 98.6 Ambulatory Status: Wheelchair Pulse (bpm): 79 Discharge Destination: Home Respiratory Rate (breaths/min): 18 Transportation: Other Blood Pressure (mmHg): 96/69 Accompanied By: caregiver Schedule Follow-up Appointment: Yes Clinical Summary of Care: Patient Declined Electronic Signature(s) Signed: 12/02/2019 7:44:20 AM By: Kela Millin Entered By: Kela Millin on 11/30/2019 17:26:32 -------------------------------------------------------------------------------- Lower Extremity Assessment Details Patient Name: Date of Service: Pamela Fuller, Pamela Fuller 11/30/2019 3:15 PM Medical Record [id number] Patient Account Number:  [id number] Date of Birth/Sex: Treating RN: April 24, 1981 (39 y.o. Female) Baruch Gouty Primary Care Evella Kasal: Clovia Cuff Other Clinician: Referring Degan Hanser: Treating Idonia Zollinger/Extender:Robson, Celso Sickle, PHILIP Weeks in Treatment: 13 Electronic Signature(s) Signed: 11/30/2019 6:19:30 PM By: Baruch Gouty RN, BSN Entered By: Baruch Gouty on 11/30/2019 15:43:20 -------------------------------------------------------------------------------- Multi Wound Chart Details Patient Name: Date of Service: Pamela Fuller, Pamela Fuller 11/30/2019 3:15 PM Medical Record [id number] Patient Account Number: [id number] Date of Birth/Sex: Treating RN: Sep 21, 1981 (39 y.o. Female) Primary Care Aarohi Redditt: Clovia Cuff Other Clinician: Referring Ellyana Crigler: Treating Perkins Molina/Extender:Robson, Celso Sickle, PHILIP Weeks in Treatment: 13 Vital Signs Height(in): 67 Pulse(bpm): 79 Weight(lbs): 145 Blood Pressure(mmHg): 96/69 Body Mass Index(BMI): 23 Temperature(F): 98.6 Respiratory 18 Rate(breaths/min): Photos: [1:No Photos] [N/A:N/A] Wound Location: [1:Sacrum] [N/A:N/A] Wounding Event: [1:Pressure Injury] [  N/A:N/A] Primary Etiology: [1:Pressure Ulcer] [N/A:N/A] Comorbid History: [1:Aspiration, Crohns, Rheumatoid Arthritis, Quadriplegia, Paraplegia] [N/A:N/A] Date Acquired: [1:05/21/2019] [N/A:N/A] Weeks of Treatment: [1:13] [N/A:N/A] Wound Status: [1:Open] [N/A:N/A] Measurements L x W x D 4.2x5.5x2 [N/A:N/A] (cm) Area (cm) : [1:18.143] [N/A:N/A] Volume (cm) : [1:36.285] [N/A:N/A] % Reduction in Area: [1:-1183.10%] [N/A:N/A] % Reduction in Volume: -8457.80% [N/A:N/A] Starting Position 1 2 (o'clock): Ending Position 1 [1:4] (o'clock): Maximum Distance 1 [1:1.3] (cm): Undermining: [1:Yes] [N/A:N/A] Classification: [1:Category/Stage III] [N/A:N/A] Exudate Amount: [1:Medium] [N/A:N/A] Exudate Type: [1:Serosanguineous] [N/A:N/A] Exudate Color: [1:red, brown] [N/A:N/A] Wound Margin:  [1:Flat and Intact] [N/A:N/A] Granulation Amount: [1:Medium (34-66%)] [N/A:N/A] Granulation Quality: [1:Red, Friable] [N/A:N/A] Necrotic Amount: [1:Medium (34-66%)] [N/A:N/A] Exposed Structures: [1:Fat Layer (Subcutaneous Tissue) Exposed: Yes Fascia: No Tendon: No Muscle: No Joint: No Bone: No] [N/A:N/A] Epithelialization: [1:None] [N/A:N/A] Debridement: [1:Debridement - Excisional] [N/A:N/A] Pre-procedure [1:16:15] [N/A:N/A] Verification/Time Out Taken: Pain Control: [1:Other] [N/A:N/A] Tissue Debrided: [1:Subcutaneous, Slough] [N/A:N/A] Level: [1:Skin/Subcutaneous Tissue] [N/A:N/A] Debridement Area (sq cm):23.1 [N/A:N/A] Instrument: [1:Curette] [N/A:N/A] Bleeding: [1:Moderate] [N/A:N/A] Hemostasis Achieved: [1:Pressure] [N/A:N/A] Procedural Pain: [1:0] [N/A:N/A] Post Procedural Pain: [1:0] [N/A:N/A] Debridement Treatment Procedure was tolerated [N/A:N/A] Response: [1:well] Post Debridement [1:4.2x5.5x2] [N/A:N/A] Measurements L x W x D (cm) Post Debridement [1:36.285] [N/A:N/A] Volume: (cm) Post Debridement Stage: Category/Stage III [N/A:N/A N/A] Treatment Notes Electronic Signature(s) Signed: 11/30/2019 5:32:11 PM By: Linton Ham MD Entered By: Linton Ham on 11/30/2019 16:20:22 -------------------------------------------------------------------------------- Multi-Disciplinary Care Plan Details Patient Name: Date of Service: Pamela Fuller, Pamela Fuller 11/30/2019 3:15 PM Medical Record [id number] Patient Account Number: [id number] Date of Birth/Sex: Treating RN: [birth year] (39 y.o. Female) Carlene Coria Primary Care Emoni Yang: Clovia Cuff Other Clinician: Referring Blakley Michna: Treating Brek Reece/Extender:Robson, Celso Sickle, PHILIP Weeks in Treatment: 13 Active Inactive Wound/Skin Impairment Nursing Diagnoses: Impaired tissue integrity Knowledge deficit related to ulceration/compromised skin integrity Goals: Patient/caregiver will verbalize understanding of  skin care regimen Target Resolution Date Initiated: 08/27/2019 Date Inactivated: 11/16/2019 Date: 10/29/2019 Goal Status: Met Ulcer/skin breakdown will have a volume reduction of 30% by week 4 Target Resolution Date Initiated: 08/27/2019 Date Inactivated: 10/05/2019 Date: 09/24/2019 Goal Status: Unmet Unmet Reason: comorbities Ulcer/skin breakdown will have a volume reduction of 50% by week 8 Target Resolution Date Initiated: 10/05/2019 Date Inactivated: 11/16/2019 Date: 10/29/2019 Goal Status: Unmet Unmet Reason: comrobitites Ulcer/skin breakdown will have a volume reduction of 80% by week 12 Date Initiated: 11/16/2019 Target Resolution Date: 12/03/2019 Goal Status: Active Interventions: Assess patient/caregiver ability to obtain necessary supplies Assess patient/caregiver ability to perform ulcer/skin care regimen upon admission and as needed Assess ulceration(s) every visit Provide education on ulcer and skin care Notes: Electronic Signature(s) Signed: 12/01/2019 7:19:07 AM By: Carlene Coria RN Entered By: Carlene Coria on 11/30/2019 15:02:46 -------------------------------------------------------------------------------- Pain Assessment Details Patient Name: Date of Service: Pamela Fuller, Pamela Fuller 11/30/2019 3:15 PM Medical Record [id number] Patient Account Number: [id number] Date of Birth/Sex: Treating RN: [birth year] (39 y.o. Female) Baruch Gouty Primary Care Markevius Trombetta: Clovia Cuff Other Clinician: Referring Prabhjot Maddux: Treating Damario Gillie/Extender:Robson, Celso Sickle, PHILIP Weeks in Treatment: 13 Active Problems Location of Pain Severity and Description of Pain Patient Has Paino Yes Site Locations Pain Location: Pain in Ulcers With Dressing Change: Yes Duration of the Pain. Constant / Intermittento Constant Rate the pain. Current Pain Level: 2 Character of Pain Describe the Pain: Aching Pain Management and Medication Current Pain Management: Medication: Yes Is  the Current Pain Management Adequate: Adequate How does your wound impact your activities of daily livingo Sleep: No Bathing: No Appetite: No Relationship With Others: No Bladder Continence: No Emotions: No Bowel Continence: No Work: No Toileting:  No Drive: No Dressing: No Hobbies: No Electronic Signature(s) Signed: 11/30/2019 6:19:30 PM By: Baruch Gouty RN, BSN Entered By: Baruch Gouty on 11/30/2019 15:43:13 -------------------------------------------------------------------------------- Patient/Caregiver Education Details Patient Name: Lois Huxley 1/26/2021andnbsp3:15 Date of Service: PM Medical Record [id number] Number: Patient Account Number: [id number] Treating RN: 03-16-1981 (39 y.o. Carlene Coria Date of Birth/Gender: Female) Other Clinician: Primary Care Treating ASENSO, Donovan Kail Physician: Physician/Extender: Referring Physician: Rocco Serene in Treatment: 13 Education Assessment Education Provided To: Patient Education Topics Provided Wound/Skin Impairment: Methods: Explain/Verbal Responses: State content correctly Electronic Signature(s) Signed: 12/01/2019 7:19:07 AM By: Carlene Coria RN Entered By: Carlene Coria on 11/30/2019 15:03:09 -------------------------------------------------------------------------------- Wound Assessment Details Patient Name: Date of Service: Pamela Fuller, Pamela Fuller 11/30/2019 3:15 PM Medical Record [id number] Patient Account Number: [id number] Date of Birth/Sex: Treating RN: Nov 20, 1980 (39 y.o. Female) Primary Care Vyron Fronczak: Clovia Cuff Other Clinician: Referring Zyren Sevigny: Treating Joei Frangos/Extender:Robson, Celso Sickle, PHILIP Weeks in Treatment: 13 Wound Status Wound Number: 1 Primary Pressure Ulcer Etiology: Wound Location: Sacrum Wound Open Wounding Event: Pressure Injury Status: Date Acquired: 05/21/2019 Comorbid Aspiration, Crohns, Rheumatoid Arthritis, Weeks Of Treatment:  13 History: Quadriplegia, Paraplegia Clustered Wound: No Photos Wound Measurements Length: (cm) 4.2 Width: (cm) 5.5 Depth: (cm) 2 Area: (cm) 18.143 Volume: (cm) 36.285 % Reduction in Area: -1183.1% % Reduction in Volume: -8457.8% Epithelialization: None Tunneling: No Undermining: Yes Starting Position (o'clock): 2 Ending Position (o'clock): 4 Maximum Distance: (cm) 1.3 Wound Description Classification: Category/Stage III Wound Margin: Flat and Intact Exudate Amount: Medium Exudate Type: Serosanguineous Exudate Color: red, brown Wound Bed Granulation Amount: Medium (34-66%) Granulation Quality: Red, Friable Necrotic Amount: Medium (34-66%) Necrotic Quality: Adherent Slough Foul Odor After Cleansing: No Slough/Fibrino Yes Exposed Structure Fascia Exposed: No Fat Layer (Subcutaneous Tissue) Exposed: Yes Tendon Exposed: No Muscle Exposed: No Joint Exposed: No Bone Exposed: No Treatment Notes Wound #1 (Sacrum) 1. Cleanse With Wound Cleanser 2. Periwound Care Skin Prep 3. Primary Dressing Applied Calcium Alginate Ag 4. Secondary Dressing Foam Electronic Signature(s) Signed: 12/01/2019 4:33:34 PM By: Mikeal Hawthorne EMT/HBOT Previous Signature: 11/30/2019 6:19:30 PM Version By: Baruch Gouty RN, BSN Entered By: Mikeal Hawthorne on 12/01/2019 15:54:49 -------------------------------------------------------------------------------- Vitals Details Patient Name: Date of Service: Pamela Fuller, Pamela Fuller 11/30/2019 3:15 PM Medical Record [id number] Patient Account Number: [id number] Date of Birth/Sex: Treating RN: 1980-12-12 (39 y.o. Female) Baruch Gouty Primary Care Annaelle Kasel: Clovia Cuff Other Clinician: Referring Jasdeep Kepner: Treating Helena Sardo/Extender:Robson, Celso Sickle, PHILIP Weeks in Treatment: 13 Vital Signs Time Taken: 15:42 Temperature (F): 98.6 Height (in): 67 Pulse (bpm): 79 Source: Stated Respiratory Rate (breaths/min): 18 Weight (lbs):  145 Blood Pressure (mmHg): 96/69 Source: Stated Reference Range: 80 - 120 mg / dl Body Mass Index (BMI): 22.7 Electronic Signature(s) Signed: 11/30/2019 6:19:30 PM By: Baruch Gouty RN, BSN Entered By: Baruch Gouty on 11/30/2019 15:42:38

## 2019-12-03 ENCOUNTER — Emergency Department (HOSPITAL_COMMUNITY)
Admission: EM | Admit: 2019-12-03 | Discharge: 2019-12-04 | Disposition: A | Discharge status: Home / Self Care | Payer: Medicaid Other | Attending: Emergency Medicine | Admitting: Emergency Medicine

## 2019-12-03 ENCOUNTER — Other Ambulatory Visit: Payer: Self-pay

## 2019-12-03 DIAGNOSIS — R109 Unspecified abdominal pain: Secondary | ICD-10-CM | POA: Diagnosis present

## 2019-12-03 DIAGNOSIS — Z79899 Other long term (current) drug therapy: Secondary | ICD-10-CM | POA: Insufficient documentation

## 2019-12-03 DIAGNOSIS — I10 Essential (primary) hypertension: Secondary | ICD-10-CM | POA: Insufficient documentation

## 2019-12-03 DIAGNOSIS — N3000 Acute cystitis without hematuria: Secondary | ICD-10-CM | POA: Diagnosis not present

## 2019-12-03 LAB — COMPREHENSIVE METABOLIC PANEL
ALT: 11 U/L (ref 0–44)
AST: 13 U/L — ABNORMAL LOW (ref 15–41)
Albumin: 3.2 g/dL — ABNORMAL LOW (ref 3.5–5.0)
Alkaline Phosphatase: 87 U/L (ref 38–126)
Anion gap: 8 (ref 5–15)
BUN: 9 mg/dL (ref 6–20)
CO2: 25 mmol/L (ref 22–32)
Calcium: 8.6 mg/dL — ABNORMAL LOW (ref 8.9–10.3)
Chloride: 106 mmol/L (ref 98–111)
Creatinine, Ser: 0.31 mg/dL — ABNORMAL LOW (ref 0.44–1.00)
GFR calc Af Amer: >60 mL/min (ref >60)
GFR calc non Af Amer: >60 mL/min (ref >60)
Glucose, Bld: 97 mg/dL (ref 70–99)
Potassium: 3.1 mmol/L — ABNORMAL LOW (ref 3.5–5.1)
Sodium: 139 mmol/L (ref 135–145)
Total Bilirubin: 0.6 mg/dL (ref 0.3–1.2)
Total Protein: 7.8 g/dL (ref 6.5–8.1)

## 2019-12-03 LAB — URINALYSIS, ROUTINE W REFLEX MICROSCOPIC
Bilirubin Urine: NEGATIVE
Glucose, UA: NEGATIVE mg/dL
Hgb urine dipstick: NEGATIVE
Ketones, ur: 5 mg/dL — AB
Nitrite: POSITIVE — AB
Protein, ur: 100 mg/dL — AB
Specific Gravity, Urine: 1.026 (ref 1.005–1.030)
WBC, UA: >50 WBC/hpf — ABNORMAL HIGH (ref 0–5)
pH: 6 (ref 5.0–8.0)

## 2019-12-03 LAB — CBC WITH DIFFERENTIAL/PLATELET
Abs Immature Granulocytes: 0.03 10*3/uL (ref 0.00–0.07)
Basophils Absolute: 0.1 10*3/uL (ref 0.0–0.1)
Basophils Relative: 1 %
Eosinophils Absolute: 0.3 10*3/uL (ref 0.0–0.5)
Eosinophils Relative: 3 %
HCT: 38.4 % (ref 36.0–46.0)
Hemoglobin: 12 g/dL (ref 12.0–15.0)
Immature Granulocytes: 0 %
Lymphocytes Relative: 19 %
Lymphs Abs: 1.6 10*3/uL (ref 0.7–4.0)
MCH: 27.1 pg (ref 26.0–34.0)
MCHC: 31.3 g/dL (ref 30.0–36.0)
MCV: 86.7 fL (ref 80.0–100.0)
Monocytes Absolute: 0.6 10*3/uL (ref 0.1–1.0)
Monocytes Relative: 8 %
Neutro Abs: 5.7 10*3/uL (ref 1.7–7.7)
Neutrophils Relative %: 69 %
Platelets: 259 10*3/uL (ref 150–400)
RBC: 4.43 MIL/uL (ref 3.87–5.11)
RDW: 15.6 % — ABNORMAL HIGH (ref 11.5–15.5)
WBC: 8.3 10*3/uL (ref 4.0–10.5)
nRBC: 0 % (ref 0.0–0.2)

## 2019-12-03 LAB — LIPASE, BLOOD: Lipase: 21 U/L (ref 11–51)

## 2019-12-03 MED ORDER — CEPHALEXIN 500 MG PO CAPS: 500.0000 mg | ORAL_CAPSULE | Freq: Four times a day (QID) | ORAL | 0 refills | Status: DC

## 2019-12-03 MED ORDER — SODIUM CHLORIDE 0.9 % IV SOLN
1.0000 g | Freq: Once | INTRAVENOUS | Status: AC
  Administered 2019-12-03: 21:00:00 1 g via INTRAVENOUS
  Filled 2019-12-03: qty 10

## 2019-12-03 NOTE — Discharge Instructions (Addendum)
Take the antibiotic Keflex as directed.  Urine today's been sent off for culture.  Did have positive nitrite so there is a high probability there is infection.  Labs otherwise without acute findings.

## 2019-12-03 NOTE — ED Notes (Signed)
[facility] contacted for transport. Paperwork printed.

## 2019-12-03 NOTE — ED Triage Notes (Signed)
Per EMS, patient from home, c/o UTI x2 weeks. States refused to take abx because they "make her feel sick." Foley catheter in place.

## 2019-12-03 NOTE — ED Provider Notes (Signed)
[facility] DEPT Provider Note   CSN: [id number] Arrival date & time: 12/03/19  1856     History Chief Complaint  Patient presents with  . Abdominal Pain  . Possible UTI    Pamela Fuller is a 39 y.o. female.  Patient seen January 20.  Patient brought in today for concerns of possible urinary tract infection and abdominal pain.  Patient chart review shows that she 35-year-old female with a history of cerebral palsy, hypotension, chronic suprapubic catheter, a functional quadriplegic, posttraumatic stress disorder, schizoaffective disorder.  Patient has a colostomy as well.  Patient had similar complaint when she was seen on the 20th lower abdominal pain for 3 days.  She has been diagnosed with urinary tract infection several times in the past.  Oftentimes she will not take the antibiotics.  When she was seen on the 20th urine culture did not grow anything from the suprapubic catheter.  The patient was discharged home on Cipro.  Patient had CT scan of her abdomen done at that time as well as labs without significant findings.  Notes from the visit in the ED on January 20 stated that she was started on Cipro.  But it looks like there is a pending prescription which was not either picked up or is she not taking for Keflex.  The patient did confirm not taking any antibiotics currently.        Past Medical History:  Diagnosis Date  . Cerebral palsy [facility])     Patient Active Problem List   Diagnosis Date Noted  . Acute pain of right knee 11/11/2019  . Closed fracture of distal end of femur, unspecified fracture morphology, initial encounter ([place]) 11/11/2019  . Iron deficiency anemia 09/01/2019  . Benign essential hypertension 08/31/2019  . Cerebral palsy ([place]) 08/31/2019  . Chronic suprapubic catheter ([place]) 08/31/2019  . Functional quadriplegia ([place]) 08/31/2019  . PTSD (post-traumatic stress disorder) 08/31/2019  . Sacral decubitus ulcer, stage IV ([place])  08/31/2019  . Schizoaffective disorder ([place]) 08/31/2019    No past surgical history on file.   OB History   No obstetric history on file.     No family history on file.  Social History   Tobacco Use  . Smoking status: Never Smoker  . Smokeless tobacco: Never Used  Substance Use Topics  . Alcohol use: Not on file  . Drug use: Not on file    Home Medications Prior to Admission medications   Medication Sig Start Date End Date Taking? Authorizing Provider  baclofen (LIORESAL) 10 MG tablet Take 1.5 tablets (15 mg total) by mouth 3 (three) times daily. 10/03/19  Yes Quentin Cornwall, [place] N, PA-C  dorzolamide-timolol (COSOPT) 22.3-6.8 MG/ML ophthalmic solution Place 1 drop into both eyes 2 (two) times daily.   Yes [provider]  latanoprost (XALATAN) 0.005 % ophthalmic solution Place 1 drop into both eyes at bedtime.   Yes [provider]  Polyethylene Glycol 3350 (PEG 3350) 17 GM/SCOOP POWD Take 17 g by mouth every other day.   Yes [provider]  sertraline (ZOLOFT) 50 MG tablet Take 50 mg by mouth daily.   Yes [provider]  traMADol (ULTRAM) 50 MG tablet Take 1 tablet (50 mg total) by mouth every 8 (eight) hours as needed. Patient taking differently: Take 50 mg by mouth every 8 (eight) hours as needed for moderate pain.  10/03/19  Yes Robinson, [place] N, PA-C  cephALEXin (KEFLEX) 500 MG capsule Take 1 capsule (500 mg total) by  mouth 4 (four) times daily for 10 days. 11/24/19 12/04/19  Couture, Cortni S, PA-C  cephALEXin (KEFLEX) 500 MG capsule Take 1 capsule (500 mg total) by mouth 4 (four) times daily. 12/03/19   Fredia Sorrow, MD    Allergies    Levaquin [levofloxacin], Codeine, Docusate, Quinolones, and Sulfa antibiotics  Review of Systems   Review of Systems  Constitutional: Negative for chills and fever.  HENT: Negative for congestion, rhinorrhea and sore throat.   Eyes: Negative for visual disturbance.  Respiratory: Negative for  cough and shortness of breath.   Cardiovascular: Negative for chest pain and leg swelling.  Gastrointestinal: Positive for abdominal pain. Negative for diarrhea, nausea and vomiting.  Genitourinary: Negative for dysuria.  Musculoskeletal: Negative for back pain and neck pain.  Skin: Negative for rash.  Neurological: Negative for dizziness, light-headedness and headaches.  Hematological: Does not bruise/bleed easily.  Psychiatric/Behavioral: Negative for confusion.    Physical Exam Updated Vital Signs BP 111/75   Pulse 68   Temp 97.7 F (36.5 C) (Oral)   Resp 17   LMP 11/03/2019   SpO2 98%   Physical Exam Vitals and nursing note reviewed.  Constitutional:      General: She is not in acute distress.    Appearance: Normal appearance. She is well-developed.  HENT:     Head: Normocephalic and atraumatic.  Eyes:     Extraocular Movements: Extraocular movements intact.     Conjunctiva/sclera: Conjunctivae normal.     Pupils: Pupils are equal, round, and reactive to light.  Cardiovascular:     Rate and Rhythm: Normal rate and regular rhythm.     Heart sounds: No murmur.  Pulmonary:     Effort: Pulmonary effort is normal. No respiratory distress.     Breath sounds: Normal breath sounds.  Abdominal:     Palpations: Abdomen is soft.     Tenderness: There is no abdominal tenderness.     Comments: Colostomy left-sided abdomen good output appears healthy.  Suprapubic catheter in place.  Some erythema around skin where it goes in.  Urine is somewhat cloudy in nature.  Musculoskeletal:     Cervical back: Normal range of motion and neck supple.  Skin:    General: Skin is warm and dry.  Neurological:     Mental Status: She is alert. Mental status is at baseline.     ED Results / Procedures / Treatments   Labs (all labs ordered are listed, but only abnormal results are displayed) Labs Reviewed  URINALYSIS, ROUTINE W REFLEX MICROSCOPIC - Abnormal; Notable for the following  components:      Result Value   Color, Urine AMBER (*)    APPearance CLOUDY (*)    Ketones, ur 5 (*)    Protein, ur 100 (*)    Nitrite POSITIVE (*)    Leukocytes,Ua LARGE (*)    WBC, UA >50 (*)    Bacteria, UA MANY (*)    All other components within normal limits  CBC WITH DIFFERENTIAL/PLATELET - Abnormal; Notable for the following components:   RDW 15.6 (*)    All other components within normal limits  COMPREHENSIVE METABOLIC PANEL - Abnormal; Notable for the following components:   Potassium 3.1 (*)    Creatinine, Ser 0.31 (*)    Calcium 8.6 (*)    Albumin 3.2 (*)    AST 13 (*)    All other components within normal limits  URINE CULTURE  LIPASE, BLOOD    EKG None  Radiology No  results found.  Procedures Procedures (including critical care time)  Medications Ordered in ED Medications  cefTRIAXone (ROCEPHIN) 1 g in sodium chloride 0.9 % 100 mL IVPB (0 g Intravenous Stopped 12/03/19 2150)    ED Course  I have reviewed the triage vital signs and the nursing notes.  Pertinent labs & imaging results that were available during my care of the patient were reviewed by me and considered in my medical decision making (see chart for details).    MDM Rules/Calculators/A&P                      Urinalysis here nitrite positive.  Suggestive of urinary tract infection.  Patient's urinalysis from January 20 had no growth of bacteria.  Patient did not take her prescription from that time which appears from prescription notes to have been Keflex.  Although the notes at the time she was seen implied that she was prescribed Cipro.  But either way she did not take the antibiotic but appears that she did not need at that time.  Today's urinalysis suggestive of urinary tract infection even though the suprapubic catheter softened and look positive.  Urine culture sent.  Patient's basic labs CBC complete metabolic panel without any acute findings or changes no leukocytosis.  Patient given  another prescription for Keflex in case she does not have that available to pick up or does not have that at home.  Nurse will communicate with her caregiver.  Patient will be discharged back home.     Final Clinical Impression(s) / ED Diagnoses Final diagnoses:  Acute cystitis without hematuria    Rx / DC Orders ED Discharge Orders         Ordered    cephALEXin (KEFLEX) 500 MG capsule  4 times daily     12/03/19 2315           Fredia Sorrow, MD 12/03/19 2323

## 2019-12-03 NOTE — ED Notes (Signed)
Caretaker, [name], contacted and informed that pt has been discharged and will be coming home via [place].

## 2019-12-04 MED ORDER — TRAMADOL HCL 50 MG PO TABS
50.0000 mg | ORAL_TABLET | Freq: Once | ORAL | Status: AC
  Administered 2019-12-04: 05:00:00 50 mg via ORAL
  Filled 2019-12-04: qty 1

## 2019-12-05 ENCOUNTER — Other Ambulatory Visit: Payer: Self-pay

## 2019-12-05 ENCOUNTER — Encounter (HOSPITAL_COMMUNITY): Payer: Self-pay

## 2019-12-05 ENCOUNTER — Emergency Department (HOSPITAL_COMMUNITY)
Admission: EM | Admit: 2019-12-05 | Discharge: 2019-12-06 | Disposition: A | Discharge status: Home / Self Care | Payer: Medicaid Other | Attending: Emergency Medicine | Admitting: Emergency Medicine

## 2019-12-05 ENCOUNTER — Emergency Department (HOSPITAL_COMMUNITY): Payer: Medicaid Other

## 2019-12-05 DIAGNOSIS — Z79899 Other long term (current) drug therapy: Secondary | ICD-10-CM | POA: Diagnosis not present

## 2019-12-05 DIAGNOSIS — G809 Cerebral palsy, unspecified: Secondary | ICD-10-CM | POA: Diagnosis not present

## 2019-12-05 DIAGNOSIS — Z0471 Encounter for examination and observation following alleged adult physical abuse: Secondary | ICD-10-CM | POA: Insufficient documentation

## 2019-12-05 DIAGNOSIS — Z433 Encounter for attention to colostomy: Secondary | ICD-10-CM | POA: Diagnosis present

## 2019-12-05 MED ORDER — POLYETHYLENE GLYCOL 3350 17 G PO PACK: 17.0000 g | PACK | ORAL | Status: DC

## 2019-12-05 MED ORDER — DORZOLAMIDE HCL-TIMOLOL MAL 2-0.5 % OP SOLN
1.0000 [drp] | Freq: Two times a day (BID) | OPHTHALMIC | Status: DC
  Administered 2019-12-06 (×2): 1 [drp] via OPHTHALMIC
  Filled 2019-12-05: qty 10

## 2019-12-05 MED ORDER — CEPHALEXIN 500 MG PO CAPS
500.0000 mg | ORAL_CAPSULE | Freq: Two times a day (BID) | ORAL | Status: DC
  Administered 2019-12-06 (×2): 500 mg via ORAL
  Filled 2019-12-05 (×2): qty 1

## 2019-12-05 MED ORDER — TRAMADOL HCL 50 MG PO TABS: 50.0000 mg | ORAL_TABLET | Freq: Three times a day (TID) | ORAL | Status: DC | PRN

## 2019-12-05 MED ORDER — BACLOFEN 10 MG PO TABS
15.0000 mg | ORAL_TABLET | Freq: Three times a day (TID) | ORAL | Status: DC
  Administered 2019-12-06 (×2): 15 mg via ORAL
  Filled 2019-12-05 (×2): qty 2

## 2019-12-05 MED ORDER — LATANOPROST 0.005 % OP SOLN
1.0000 [drp] | Freq: Every day | OPHTHALMIC | Status: DC
  Administered 2019-12-06: 1 [drp] via OPHTHALMIC
  Filled 2019-12-05: qty 2.5

## 2019-12-05 MED ORDER — SERTRALINE HCL 50 MG PO TABS
50.0000 mg | ORAL_TABLET | Freq: Every day | ORAL | Status: DC
  Administered 2019-12-06: 50 mg via ORAL
  Filled 2019-12-05: qty 1
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
## 2019-12-06 LAB — PREGNANCY, URINE: Preg Test, Ur: NEGATIVE
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
## 2019-12-06 NOTE — Progress Notes (Signed)
[facility] called for transport.  

## 2019-12-06 NOTE — ED Notes (Signed)
New colostomy bag placed over stoma. Stoma was beefy red and moist.
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
## 2019-12-07 LAB — GC/CHLAMYDIA PROBE AMP (~~LOC~~) NOT AT ARMC
Chlamydia: NEGATIVE
Neisseria Gonorrhea: NEGATIVE

## 2019-12-09 ENCOUNTER — Ambulatory Visit: Payer: Medicaid Other | Admitting: Family Medicine

## 2019-12-14 ENCOUNTER — Encounter: Payer: Self-pay | Admitting: Family Medicine

## 2019-12-14 ENCOUNTER — Other Ambulatory Visit: Payer: Self-pay

## 2019-12-14 ENCOUNTER — Ambulatory Visit (INDEPENDENT_AMBULATORY_CARE_PROVIDER_SITE_OTHER): Payer: Medicaid Other | Admitting: Family Medicine

## 2019-12-14 VITALS — Ht 67.0 in | Wt 160.0 lb

## 2019-12-14 DIAGNOSIS — S72409D Unspecified fracture of lower end of unspecified femur, subsequent encounter for closed fracture with routine healing: Secondary | ICD-10-CM

## 2019-12-14 MED ORDER — GENERIC EXTERNAL MEDICATION
4.50 | Status: DC
Start: 2019-12-12 — End: 2019-12-14

## 2019-12-14 MED ORDER — ONDANSETRON HCL 4 MG/2ML IJ SOLN
4.00 | INTRAMUSCULAR | Status: DC
Start: ? — End: 2019-12-14

## 2019-12-14 MED ORDER — POLYETHYLENE GLYCOL 3350 17 GM/SCOOP PO POWD
17.00 | ORAL | Status: DC
Start: 2019-12-13 — End: 2019-12-14

## 2019-12-14 MED ORDER — DEXTROMETHORPHAN-GUAIFENESIN 10-100 MG/5ML PO LIQD
5.00 | ORAL | Status: DC
Start: ? — End: 2019-12-14

## 2019-12-14 MED ORDER — SERTRALINE HCL 50 MG PO TABS
50.00 | ORAL_TABLET | ORAL | Status: DC
Start: 2019-12-13 — End: 2019-12-14

## 2019-12-14 MED ORDER — HYDROXYZINE HCL 25 MG PO TABS
25.00 | ORAL_TABLET | ORAL | Status: DC
Start: ? — End: 2019-12-14

## 2019-12-14 MED ORDER — BACLOFEN 10 MG PO TABS
15.00 | ORAL_TABLET | ORAL | Status: DC
Start: 2019-12-12 — End: 2019-12-14

## 2019-12-14 MED ORDER — CARVEDILOL 3.125 MG PO TABS
6.25 | ORAL_TABLET | ORAL | Status: DC
Start: 2019-12-12 — End: 2019-12-14

## 2019-12-14 MED ORDER — BISACODYL 5 MG PO TBEC
10.00 | DELAYED_RELEASE_TABLET | ORAL | Status: DC
Start: ? — End: 2019-12-14

## 2019-12-14 MED ORDER — RISPERIDONE 1 MG PO TABS
0.50 | ORAL_TABLET | ORAL | Status: DC
Start: 2019-12-12 — End: 2019-12-14

## 2019-12-14 MED ORDER — TRAMADOL HCL 50 MG PO TABS
50.00 | ORAL_TABLET | ORAL | Status: DC
Start: ? — End: 2019-12-14

## 2019-12-14 MED ORDER — MELATONIN 3 MG PO TABS
3.00 | ORAL_TABLET | ORAL | Status: DC
Start: ? — End: 2019-12-14

## 2019-12-14 MED ORDER — ACETAMINOPHEN 325 MG PO TABS
650.00 | ORAL_TABLET | ORAL | Status: DC
Start: ? — End: 2019-12-14

## 2019-12-14 MED ORDER — SERTRALINE HCL 100 MG PO TABS
100.00 | ORAL_TABLET | ORAL | Status: DC
Start: 2019-12-12 — End: 2019-12-14

## 2019-12-14 MED ORDER — LATANOPROST 0.005 % OP SOLN
1.00 | OPHTHALMIC | Status: DC
Start: 2019-12-13 — End: 2019-12-14

## 2019-12-14 MED ORDER — ALUM & MAG HYDROXIDE-SIMETH 200-200-20 MG/5ML PO SUSP
30.00 | ORAL | Status: DC
Start: ? — End: 2019-12-14

## 2019-12-14 MED ORDER — ENOXAPARIN SODIUM 40 MG/0.4ML ~~LOC~~ SOLN
40.00 | SUBCUTANEOUS | Status: DC
Start: 2019-12-12 — End: 2019-12-14
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
## 2019-12-14 NOTE — Patient Instructions (Signed)
Good to see you Please try to take the splint off every 2-3 days to check the skin and moisturize.  Please keep the splint on otherwise  Please send me a message in MyChart with any questions or updates.  Please see me back in 4 weeks.   --Dr. Raeford Razor
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
## 2019-12-17 ENCOUNTER — Emergency Department (HOSPITAL_BASED_OUTPATIENT_CLINIC_OR_DEPARTMENT_OTHER): Payer: Medicaid Other

## 2019-12-17 ENCOUNTER — Other Ambulatory Visit: Payer: Self-pay

## 2019-12-17 ENCOUNTER — Encounter (HOSPITAL_BASED_OUTPATIENT_CLINIC_OR_DEPARTMENT_OTHER): Payer: Self-pay | Admitting: Emergency Medicine

## 2019-12-17 ENCOUNTER — Emergency Department (HOSPITAL_BASED_OUTPATIENT_CLINIC_OR_DEPARTMENT_OTHER)
Admission: EM | Admit: 2019-12-17 | Discharge: 2019-12-18 | Disposition: A | Discharge status: Home / Self Care | Payer: Medicaid Other | Attending: Emergency Medicine | Admitting: Emergency Medicine

## 2019-12-17 DIAGNOSIS — R1031 Right lower quadrant pain: Secondary | ICD-10-CM | POA: Diagnosis present

## 2019-12-17 DIAGNOSIS — Z79899 Other long term (current) drug therapy: Secondary | ICD-10-CM | POA: Diagnosis not present

## 2019-12-17 DIAGNOSIS — Z882 Allergy status to sulfonamides status: Secondary | ICD-10-CM | POA: Insufficient documentation

## 2019-12-17 DIAGNOSIS — Z885 Allergy status to narcotic agent status: Secondary | ICD-10-CM | POA: Insufficient documentation

## 2019-12-17 DIAGNOSIS — Z881 Allergy status to other antibiotic agents status: Secondary | ICD-10-CM | POA: Diagnosis not present

## 2019-12-17 DIAGNOSIS — Z888 Allergy status to other drugs, medicaments and biological substances status: Secondary | ICD-10-CM | POA: Insufficient documentation

## 2019-12-17 DIAGNOSIS — N390 Urinary tract infection, site not specified: Secondary | ICD-10-CM | POA: Diagnosis not present

## 2019-12-17 DIAGNOSIS — G809 Cerebral palsy, unspecified: Secondary | ICD-10-CM | POA: Insufficient documentation

## 2019-12-17 LAB — URINALYSIS, ROUTINE W REFLEX MICROSCOPIC
Bilirubin Urine: NEGATIVE
Glucose, UA: NEGATIVE mg/dL
Ketones, ur: NEGATIVE mg/dL
Nitrite: POSITIVE — AB
Protein, ur: 30 mg/dL — AB
Specific Gravity, Urine: 1.025 (ref 1.005–1.030)
pH: 6 (ref 5.0–8.0)

## 2019-12-17 LAB — COMPREHENSIVE METABOLIC PANEL
ALT: 21 U/L (ref 0–44)
AST: 12 U/L — ABNORMAL LOW (ref 15–41)
Albumin: 3.1 g/dL — ABNORMAL LOW (ref 3.5–5.0)
Alkaline Phosphatase: 85 U/L (ref 38–126)
Anion gap: 7 (ref 5–15)
BUN: 10 mg/dL (ref 6–20)
CO2: 24 mmol/L (ref 22–32)
Calcium: 8.5 mg/dL — ABNORMAL LOW (ref 8.9–10.3)
Chloride: 105 mmol/L (ref 98–111)
Creatinine, Ser: 0.4 mg/dL — ABNORMAL LOW (ref 0.44–1.00)
GFR calc Af Amer: >60 mL/min (ref >60)
GFR calc non Af Amer: >60 mL/min (ref >60)
Glucose, Bld: 94 mg/dL (ref 70–99)
Potassium: 3.1 mmol/L — ABNORMAL LOW (ref 3.5–5.1)
Sodium: 136 mmol/L (ref 135–145)
Total Bilirubin: 0.4 mg/dL (ref 0.3–1.2)
Total Protein: 7.9 g/dL (ref 6.5–8.1)

## 2019-12-17 LAB — CBC WITH DIFFERENTIAL/PLATELET
Abs Immature Granulocytes: 0.02 10*3/uL (ref 0.00–0.07)
Basophils Absolute: 0.1 10*3/uL (ref 0.0–0.1)
Basophils Relative: 1 %
Eosinophils Absolute: 0.3 10*3/uL (ref 0.0–0.5)
Eosinophils Relative: 4 %
HCT: 40.4 % (ref 36.0–46.0)
Hemoglobin: 12.5 g/dL (ref 12.0–15.0)
Immature Granulocytes: 0 %
Lymphocytes Relative: 20 %
Lymphs Abs: 1.6 10*3/uL (ref 0.7–4.0)
MCH: 26.7 pg (ref 26.0–34.0)
MCHC: 30.9 g/dL (ref 30.0–36.0)
MCV: 86.3 fL (ref 80.0–100.0)
Monocytes Absolute: 0.5 10*3/uL (ref 0.1–1.0)
Monocytes Relative: 6 %
Neutro Abs: 5.7 10*3/uL (ref 1.7–7.7)
Neutrophils Relative %: 69 %
Platelets: 300 10*3/uL (ref 150–400)
RBC: 4.68 MIL/uL (ref 3.87–5.11)
RDW: 15.3 % (ref 11.5–15.5)
WBC: 8.2 10*3/uL (ref 4.0–10.5)
nRBC: 0 % (ref 0.0–0.2)

## 2019-12-17 LAB — LIPASE, BLOOD: Lipase: 21 U/L (ref 11–51)

## 2019-12-17 LAB — URINALYSIS, MICROSCOPIC (REFLEX)
RBC / HPF: >50 RBC/hpf (ref 0–5)
WBC, UA: >50 WBC/hpf (ref 0–5)

## 2019-12-17 MED ORDER — HYDROMORPHONE HCL 1 MG/ML IJ SOLN
0.5000 mg | Freq: Once | INTRAMUSCULAR | Status: AC
  Administered 2019-12-17: 17:00:00 0.5 mg via INTRAVENOUS
  Filled 2019-12-17: qty 1

## 2019-12-17 MED ORDER — SODIUM CHLORIDE 0.9 % IV SOLN
1.0000 g | Freq: Once | INTRAVENOUS | Status: AC
  Administered 2019-12-17: 1 g via INTRAVENOUS
  Filled 2019-12-17: qty 10

## 2019-12-17 MED ORDER — IOHEXOL 300 MG/ML  SOLN
100.0000 mL | Freq: Once | INTRAMUSCULAR | Status: AC | PRN
  Administered 2019-12-17: 100 mL via INTRAVENOUS

## 2019-12-17 MED ORDER — SODIUM CHLORIDE 0.9 % IV BOLUS
500.0000 mL | Freq: Once | INTRAVENOUS | Status: AC
  Administered 2019-12-17: 17:00:00 500 mL via INTRAVENOUS

## 2019-12-17 MED ORDER — ONDANSETRON HCL 4 MG/2ML IJ SOLN
4.0000 mg | Freq: Once | INTRAMUSCULAR | Status: AC
  Administered 2019-12-17: 4 mg via INTRAVENOUS
  Filled 2019-12-17: qty 2

## 2019-12-17 MED ORDER — CEPHALEXIN 500 MG PO CAPS: 500.0000 mg | ORAL_CAPSULE | Freq: Two times a day (BID) | ORAL | 0 refills | Status: DC

## 2019-12-17 NOTE — ED Notes (Signed)
Spoke to [name] [phone], pt's caregiver, re: pt pending discharge; sts she will be at the house when pt arrives

## 2019-12-17 NOTE — Discharge Instructions (Signed)
Take Keflex as prescribed and complete the full course. Recheck with your primary care provider.
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
## 2019-12-17 NOTE — ED Triage Notes (Signed)
Per EMS:  Pt having abdominal pain radiating to back.  Some nausea.  No fever.
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
## 2019-12-21 ENCOUNTER — Encounter (HOSPITAL_BASED_OUTPATIENT_CLINIC_OR_DEPARTMENT_OTHER): Payer: Medicaid Other | Attending: Internal Medicine | Admitting: Internal Medicine

## 2019-12-21 LAB — URINE CULTURE: Culture: >=100000 — AB

## 2019-12-22 ENCOUNTER — Telehealth: Payer: Self-pay

## 2019-12-22 NOTE — Telephone Encounter (Signed)
Post ED Visit - Positive Culture Follow-up: Unsuccessful Patient Follow-up  Culture assessed and recommendations reviewed by:  []  Elenor Quinones, Pharm.D. []  Heide Guile, Pharm.D., BCPS AQ-ID []  Parks Neptune, Pharm.D., BCPS []  Alycia Rossetti, Pharm.D., BCPS []  [place], Pharm.D., BCPS, AAHIVP []  Legrand Como, Pharm.D., BCPS, AAHIVP []  Wynell Balloon, PharmD []  Vincenza Hews, PharmD, BCPS Cristela Felt Pharm D Positive urine culture symptom check  May need to return for IV abx if symptoms per Cortni Couture PA []  Patient discharged without antimicrobial prescription and treatment is now indicated []  Organism is resistant to prescribed ED discharge antimicrobial []  Patient with positive blood cultures   Unable to contact patient after 3 attempts, letter will be sent to address on file  Pamela Fuller 12/22/2019, 10:11 AM

## 2019-12-25 ENCOUNTER — Emergency Department (HOSPITAL_COMMUNITY)
Admission: EM | Admit: 2019-12-25 | Discharge: 2019-12-25 | Disposition: A | Discharge status: Home / Self Care | Payer: Medicaid Other | Attending: Emergency Medicine | Admitting: Emergency Medicine

## 2019-12-25 ENCOUNTER — Other Ambulatory Visit: Payer: Self-pay

## 2019-12-25 ENCOUNTER — Encounter (HOSPITAL_COMMUNITY): Payer: Self-pay | Admitting: Emergency Medicine

## 2019-12-25 DIAGNOSIS — Z0471 Encounter for examination and observation following alleged adult physical abuse: Secondary | ICD-10-CM | POA: Diagnosis not present

## 2019-12-25 DIAGNOSIS — Z79899 Other long term (current) drug therapy: Secondary | ICD-10-CM | POA: Diagnosis not present

## 2019-12-25 DIAGNOSIS — G809 Cerebral palsy, unspecified: Secondary | ICD-10-CM | POA: Insufficient documentation

## 2019-12-25 DIAGNOSIS — Z466 Encounter for fitting and adjustment of urinary device: Secondary | ICD-10-CM | POA: Diagnosis not present

## 2019-12-25 DIAGNOSIS — R197 Diarrhea, unspecified: Secondary | ICD-10-CM | POA: Diagnosis present

## 2019-12-25 LAB — COMPREHENSIVE METABOLIC PANEL
ALT: 9 U/L (ref 0–44)
AST: 12 U/L — ABNORMAL LOW (ref 15–41)
Albumin: 3.3 g/dL — ABNORMAL LOW (ref 3.5–5.0)
Alkaline Phosphatase: 87 U/L (ref 38–126)
Anion gap: 9 (ref 5–15)
BUN: 11 mg/dL (ref 6–20)
CO2: 23 mmol/L (ref 22–32)
Calcium: 8.7 mg/dL — ABNORMAL LOW (ref 8.9–10.3)
Chloride: 107 mmol/L (ref 98–111)
Creatinine, Ser: 0.4 mg/dL — ABNORMAL LOW (ref 0.44–1.00)
GFR calc Af Amer: >60 mL/min (ref >60)
GFR calc non Af Amer: >60 mL/min (ref >60)
Glucose, Bld: 105 mg/dL — ABNORMAL HIGH (ref 70–99)
Potassium: 3.3 mmol/L — ABNORMAL LOW (ref 3.5–5.1)
Sodium: 139 mmol/L (ref 135–145)
Total Bilirubin: 0.5 mg/dL (ref 0.3–1.2)
Total Protein: 8.5 g/dL — ABNORMAL HIGH (ref 6.5–8.1)

## 2019-12-25 LAB — URINALYSIS, ROUTINE W REFLEX MICROSCOPIC
Bilirubin Urine: NEGATIVE
Glucose, UA: NEGATIVE mg/dL
Ketones, ur: NEGATIVE mg/dL
Nitrite: NEGATIVE
Protein, ur: 100 mg/dL — AB
RBC / HPF: >50 RBC/hpf — ABNORMAL HIGH (ref 0–5)
Specific Gravity, Urine: 1.026 (ref 1.005–1.030)
WBC, UA: >50 WBC/hpf — ABNORMAL HIGH (ref 0–5)
pH: 5 (ref 5.0–8.0)

## 2019-12-25 LAB — CBC WITH DIFFERENTIAL/PLATELET
Abs Immature Granulocytes: 0.03 10*3/uL (ref 0.00–0.07)
Basophils Absolute: 0.1 10*3/uL (ref 0.0–0.1)
Basophils Relative: 1 %
Eosinophils Absolute: 0.4 10*3/uL (ref 0.0–0.5)
Eosinophils Relative: 4 %
HCT: 40.5 % (ref 36.0–46.0)
Hemoglobin: 12.6 g/dL (ref 12.0–15.0)
Immature Granulocytes: 0 %
Lymphocytes Relative: 14 %
Lymphs Abs: 1.4 10*3/uL (ref 0.7–4.0)
MCH: 26.9 pg (ref 26.0–34.0)
MCHC: 31.1 g/dL (ref 30.0–36.0)
MCV: 86.5 fL (ref 80.0–100.0)
Monocytes Absolute: 0.4 10*3/uL (ref 0.1–1.0)
Monocytes Relative: 4 %
Neutro Abs: 7.5 10*3/uL (ref 1.7–7.7)
Neutrophils Relative %: 77 %
Platelets: 329 10*3/uL (ref 150–400)
RBC: 4.68 MIL/uL (ref 3.87–5.11)
RDW: 16.2 % — ABNORMAL HIGH (ref 11.5–15.5)
WBC: 9.7 10*3/uL (ref 4.0–10.5)
nRBC: 0 % (ref 0.0–0.2)

## 2019-12-25 LAB — LACTIC ACID, PLASMA: Lactic Acid, Venous: 1.2 mmol/L (ref 0.5–1.9)

## 2019-12-25 MED ORDER — SODIUM CHLORIDE 0.9 % IV BOLUS
1000.0000 mL | Freq: Once | INTRAVENOUS | Status: AC
  Administered 2019-12-25: 1000 mL via INTRAVENOUS

## 2019-12-25 NOTE — ED Notes (Signed)
Attempted to collect urine specimen but none to collect at this time.

## 2019-12-25 NOTE — Discharge Instructions (Addendum)
You have been diagnosed today with Urinary Catheter Change.  At this time there does not appear to be the presence of an emergent medical condition, however there is always the potential for conditions to change. Please read and follow the below instructions.  Please return to the Emergency Department immediately for any new or worsening symptoms. Please be sure to follow up with your Primary Care Provider within one week regarding your visit today; please call their office to schedule an appointment even if you are feeling better for a follow-up visit. Your urine today showed what could be an infection.  Your urine is currently being cultured.  You will be contacted by [facility] health staff in the next few days with the culture report and with a prescription for an antibiotic that will treat any seen infection.  Please drink plenty of water and get plenty of rest.  Get help right away if: You have very bad back pain. You have very bad pain in your lower belly. You have a fever. You are sick to your stomach (nauseous). You are throwing up. You have any new/concerning or worsening of symptoms  Please read the additional information packets attached to your discharge summary.  Do not take your medicine if  develop an itchy rash, swelling in your mouth or lips, or difficulty breathing; call 911 and seek immediate emergency medical attention if this occurs.  Note: Portions of this text may have been transcribed using voice recognition software. Every effort was made to ensure accuracy; however, inadvertent computerized transcription errors may still be present.

## 2019-12-25 NOTE — Progress Notes (Signed)
12/25/2019  1040  Spoke with Baxter Flattery PT. Baxter Flattery states someone will be around today to see patient.

## 2019-12-25 NOTE — ED Provider Notes (Signed)
BLADDER CATHETERIZATION  Date/Time: 12/25/2019 3:42 AM Performed by: Belva Koziel, MD Authorized by: Beren Yniguez, MD   Consent:    Consent obtained:  Verbal   Consent given by:  Patient   Risks discussed:  Pain   Alternatives discussed:  No treatment Pre-procedure details:    Procedure purpose:  Therapeutic   Preparation: Patient was prepped and draped in usual sterile fashion   Anesthesia (see MAR for exact dosages):    Anesthesia method:  None Procedure details:    Provider performed due to:  Altered anatomy   Altered anatomy details: suprapubic cystostomy.   Catheter insertion:  Indwelling   Catheter type:  Foley   Catheter size:  16 Fr   Bladder irrigation: no     Number of attempts:  1   Urine characteristics:  Cloudy Post-procedure details:    Patient tolerance of procedure:  Tolerated well, no immediate complications Comments:     Old indwelling catheter was removed prior to insertion of new.      Brysin Towery, Jenny Reichmann, MD 12/25/19 [phone]

## 2019-12-25 NOTE — Progress Notes (Signed)
PT Cancellation Note  Patient Details Name: Pamela Fuller MRN: [id number] DOB: 1981/11/04   Cancelled Treatment:    Reason Eval/Treat Not Completed: Other (comment) spoke to RN and CSW in the ED- they report that the plan is changed, she is going back to her facility and not SNF, does not need PT note since she is already a resident at her current facility. PT signing off, thank you for the referral!    Windell Norfolk, DPT, PN1   Supplemental Physical Therapist [place]    Pager [phone] Acute Rehab Office [phone]
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
## 2019-12-25 NOTE — ED Notes (Signed)
This nurse went into the pt's room to turn off an alarm. Pt shouted at me that the bolus of fluids was causing her heart to "feel like it is exploding", and that she refused the fluid bolus. I discontinued the fluid bolus, which had gotten about 222m in.
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
## 2019-12-25 NOTE — ED Notes (Signed)
She is awake and alert. I assist her in repositioning, and covering her with blankets. I inform her we will be getting her some breakfast. She has no other requests at this time.

## 2019-12-25 NOTE — Progress Notes (Signed)
[facility] called  
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
## 2019-12-26 ENCOUNTER — Emergency Department (HOSPITAL_COMMUNITY)
Admission: EM | Admit: 2019-12-26 | Discharge: 2019-12-26 | Disposition: A | Discharge status: Home / Self Care | Payer: Medicaid Other | Attending: Emergency Medicine | Admitting: Emergency Medicine

## 2019-12-26 ENCOUNTER — Other Ambulatory Visit: Payer: Self-pay

## 2019-12-26 DIAGNOSIS — Z7189 Other specified counseling: Secondary | ICD-10-CM

## 2019-12-26 DIAGNOSIS — G809 Cerebral palsy, unspecified: Secondary | ICD-10-CM | POA: Diagnosis not present

## 2019-12-26 DIAGNOSIS — Z433 Encounter for attention to colostomy: Secondary | ICD-10-CM | POA: Diagnosis not present

## 2019-12-26 LAB — URINE CULTURE

## 2019-12-26 NOTE — ED Notes (Signed)
Patient given discharge instructions patient verbalizes understanding. [facility] called for pt.

## 2019-12-26 NOTE — ED Notes (Signed)
RN ordered ostomy materials from materials.

## 2019-12-26 NOTE — Discharge Instructions (Addendum)
Thank you for allowing me to care for you today. Please return to the emergency department if you have new or worsening symptoms.
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
## 2019-12-26 NOTE — ED Triage Notes (Signed)
Pt here via [facility] bc she took her ostomy bag off her stoma. Pt alert. VSS at this time

## 2019-12-26 NOTE — ED Notes (Signed)
RN replaced ostomy bag

## 2019-12-29 ENCOUNTER — Emergency Department (HOSPITAL_COMMUNITY)
Admission: EM | Admit: 2019-12-29 | Discharge: 2019-12-29 | Disposition: A | Discharge status: Home / Self Care | Payer: Medicaid Other | Attending: Emergency Medicine | Admitting: Emergency Medicine

## 2019-12-29 ENCOUNTER — Other Ambulatory Visit: Payer: Self-pay

## 2019-12-29 ENCOUNTER — Encounter (HOSPITAL_COMMUNITY): Payer: Self-pay | Admitting: Emergency Medicine

## 2019-12-29 DIAGNOSIS — G809 Cerebral palsy, unspecified: Secondary | ICD-10-CM | POA: Diagnosis not present

## 2019-12-29 DIAGNOSIS — R519 Headache, unspecified: Secondary | ICD-10-CM

## 2019-12-29 DIAGNOSIS — L89159 Pressure ulcer of sacral region, unspecified stage: Secondary | ICD-10-CM | POA: Diagnosis not present

## 2019-12-29 MED ORDER — ACETAMINOPHEN 500 MG PO TABS
1000.0000 mg | ORAL_TABLET | Freq: Once | ORAL | Status: DC
  Filled 2019-12-29: qty 2

## 2019-12-29 NOTE — ED Provider Notes (Signed)
[facility] EMERGENCY DEPARTMENT Provider Note   CSN: [id number] Arrival date & time: 12/29/19  1218     History Chief Complaint  Patient presents with  . Headache  . Pressure Ulcer    Pamela Fuller is a 39 y.o. female.  HPI Patient reports that she is here for a headache and because she wants her coccyx wound checked.  She wants to know how it is doing.  She does not think it gets taken good care of at her facility.  Patient reports her headache is generalized.  Is aching in nature.  Is been present for several days.  No fever.  She denies nasal congestion sinus drainage or sore throat.  No vomiting.  Patient has cerebral palsy, no change in baseline function.  Patient does not have specific complaints regarding the wound on her coccyx but wants it checked.  Patient does not have any complaints today regarding abdominal pain or vomiting.  No complaints regarding suprapubic catheter today.    Past Medical History:  Diagnosis Date  . Cerebral palsy [facility])     Patient Active Problem List   Diagnosis Date Noted  . Acute pain of right knee 11/11/2019  . Closed fracture of distal end of femur with routine healing, subsequent encounter 11/11/2019  . Iron deficiency anemia 09/01/2019  . Benign essential hypertension 08/31/2019  . Cerebral palsy ([place]) 08/31/2019  . Chronic suprapubic catheter ([place]) 08/31/2019  . 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 PTSD (post-traumatic stress disorder) 08/31/2019  . Sacral decubitus ulcer, stage IV ([place]) 08/31/2019  . Schizoaffective disorder ([place]) 08/31/2019  . Sepsis due to urinary tract infection ([place]) 07/18/2018  . Pain management contract signed 07/17/2018  . Celiac disease 07/03/2018  . IBS (irritable bowel syndrome) 07/03/2018  . Bladder stones 02/13/2018  . Flank pain 02/13/2018  . Acute cystitis with hematuria 10/31/2017  . Fecal impaction of colon ([place]) 03/24/2017  . Left lower quadrant pain 03/24/2017    . Severe protein-calorie malnutrition ([place]) 09/24/2016  . Acute constipation 08/20/2016  . Long term (current) use of antibiotics 07/26/2016  . Breakdown (mechanical) of cystostomy catheter, initial encounter ([place]) 06/09/2016  . Chronic left-sided low back pain with sciatica 08/08/2015  . Disorder of urethral catheter ([place]) 06/28/2015  . Generalized abdominal pain 04/20/2015  . Non-intractable vomiting with nausea 04/20/2015  . Urinary tract infection associated with catheterization of urinary tract, initial encounter ([place]) 03/20/2015  . Decubitus ulcer of lower back 02/13/2015  . Bowel incontinence 01/16/2015  . Chronic constipation 01/16/2015  . 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 Chronic sciatica 05/19/2014  . Calculus of kidney 08/16/2013  . History of recurrent UTIs 08/16/2013  . Neurogenic bladder 08/16/2013  . Urine retention 05/13/2013  . Scoliosis 07/16/2012    History reviewed. No pertinent surgical history.   OB History   No obstetric history on file.     History reviewed. No pertinent family history.  Social History   Tobacco Use  . Smoking status: Never Smoker  . Smokeless tobacco: Never Used  Substance Use Topics  . Alcohol use: Not on file  . Drug use: Not on file    Home Medications Prior to Admission medications   Medication Sig Start Date End Date Taking? Authorizing Provider  baclofen (LIORESAL) 10 MG tablet Take 1.5 tablets (15 mg total) by mouth 3 (three) times daily. 10/03/19   Robinson, [place] N, PA-C  dorzolamide-timolol (COSOPT) 22.3-6.8 MG/ML ophthalmic solution Place 1 drop into both eyes 2 (two) times daily.  [provider]  latanoprost (XALATAN) 0.005 % ophthalmic solution Place 1 drop into both eyes at bedtime.    [provider]  Polyethylene Glycol 3350 (PEG 3350) 17 GM/SCOOP POWD Take 17 g by mouth every other day.    [provider]  sertraline (ZOLOFT) 50 MG tablet Take 50 mg by mouth daily.     [provider]    Allergies    Levaquin [levofloxacin], Codeine, Docusate, Quinolones, and Sulfa antibiotics  Review of Systems   Review of Systems 10 Systems reviewed and are negative for acute change except as noted in the HPI. Physical Exam Updated Vital Signs BP 110/77 (BP Location: Right Arm)   Pulse 81   Temp (!) 97.5 F (36.4 C) (Oral)   Resp 15   Ht 5' 7"  (1.702 m)   Wt 72 kg   SpO2 100%   BMI 24.86 kg/m   Physical Exam Constitutional:      Comments: Patient is alert.  She is sitting in the stretcher in a semirecumbent position.  No respiratory distress.  Patient does not appear to be acutely ill.  HENT:     Head: Normocephalic and atraumatic.  Eyes:     Extraocular Movements: Extraocular movements intact.     Conjunctiva/sclera: Conjunctivae normal.  Cardiovascular:     Rate and Rhythm: Normal rate and regular rhythm.  Pulmonary:     Effort: Pulmonary effort is normal.     Breath sounds: Normal breath sounds.     Comments: Required nursing assistant to help the patient lean forward adequately to perform her lung exam on the posterior thorax.  Patient became distressed with this and claimed that we were hurting her despite trying to be very gentle in bringing her forward to perform adequate exam. Abdominal:     Comments: Patient is not permissive of complete abdominal exam.  I have been able to examine the ostomy and the site is clean and dry with a bag in place.  Patient suprapubic tube is in place.  She really will not permit standard palpation.  With even approaching light touch, patient becomes distressed and request that I stop.  Musculoskeletal:     Comments: Patient has her left lower extremity wrapped in an Ace wrap.  Right lower extremity has about 1+ edema.  Patient has flexion contractures of the wrists on both upper extremities.  She has some movement of the upper extremities but it is limited.  She has flexion contractures of the feet and knees.   These are moderate in severity.  Skin:    General: Skin is warm and dry.  Neurological:     Comments: Patient is alert.  Her speech is clear and goal oriented.  She does not seem confused.  She has severe basic neurologic compromise due to cerebral palsy.  Psychiatric:     Comments: Patient's mood is mildly anxious and contentious.    I have nursing staff Lucillie Garfinkel at bedside throughout history and physical exam to assist and for chaperone.   ED Results / Procedures / Treatments   Labs (all labs ordered are listed, but only abnormal results are displayed) Labs Reviewed - No data to display  EKG None  Radiology No results found.  Procedures Procedures (including critical care time)  Medications Ordered in ED Medications - No data to display  ED Course  I have reviewed the triage vital signs and the nursing notes.  Pertinent labs & imaging results that were available during my care  of the patient were reviewed by me and considered in my medical decision making (see chart for details).    MDM Rules/Calculators/A&P                      Patient presents with complaint of headache.  At this time patient is clinically well in appearance no symptoms to suggest emergent etiology for headache.  It is generalized been present for several days.  Patient is afebrile.  She is alert with no lethargy.  Will suggest conservative measures with Tylenol as needed.  They can continue to observe at her care facility for any fever, change mental status or development of additional symptoms.  Patient wanted her sacral wound checked as part of her chief complaint.  However, while performing physical exam with nurse at bedside throughout, patient became somewhat agitated and reported that we were hurting her even with very gentle attempts at repositioning to facilitate exam.  Patient refused abdominal exam and also refused to be rolled in order to examine her sacrum.  At this time, these are  chronic problems being managed on outpatient basis.  I do not feel that I need to physically force physical exam for adequate emergency department evaluation the patient feels uncomfortable or unwilling.  At this time, she is stable for discharge for continued outpatient management of chronic medical problems. Final Clinical Impression(s) / ED Diagnoses Final diagnoses:  Bad headache  Pressure injury of skin of sacral region, unspecified injury stage    Rx / DC Orders ED Discharge Orders    None       Charlesetta Shanks, MD 12/29/19 1352

## 2019-12-29 NOTE — ED Notes (Signed)
[facility] called for transport home
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
## 2019-12-31 ENCOUNTER — Other Ambulatory Visit: Payer: Self-pay

## 2019-12-31 ENCOUNTER — Emergency Department (HOSPITAL_COMMUNITY)
Admission: EM | Admit: 2019-12-31 | Discharge: 2020-01-01 | Disposition: A | Discharge status: Home / Self Care | Payer: Medicaid Other | Attending: Emergency Medicine | Admitting: Emergency Medicine

## 2019-12-31 ENCOUNTER — Encounter (HOSPITAL_COMMUNITY): Payer: Self-pay

## 2019-12-31 DIAGNOSIS — R52 Pain, unspecified: Secondary | ICD-10-CM | POA: Diagnosis present

## 2019-12-31 DIAGNOSIS — Z79899 Other long term (current) drug therapy: Secondary | ICD-10-CM | POA: Insufficient documentation

## 2019-12-31 DIAGNOSIS — T83511A Infection and inflammatory reaction due to indwelling urethral catheter, initial encounter: Secondary | ICD-10-CM | POA: Diagnosis not present

## 2019-12-31 DIAGNOSIS — T7621XA Adult sexual abuse, suspected, initial encounter: Secondary | ICD-10-CM | POA: Diagnosis not present

## 2019-12-31 DIAGNOSIS — N390 Urinary tract infection, site not specified: Secondary | ICD-10-CM | POA: Insufficient documentation
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
## 2020-01-01 LAB — CBC WITH DIFFERENTIAL/PLATELET
Abs Immature Granulocytes: 0.04 10*3/uL (ref 0.00–0.07)
Basophils Absolute: 0.1 10*3/uL (ref 0.0–0.1)
Basophils Relative: 1 %
Eosinophils Absolute: 0.2 10*3/uL (ref 0.0–0.5)
Eosinophils Relative: 2 %
HCT: 43.8 % (ref 36.0–46.0)
Hemoglobin: 13 g/dL (ref 12.0–15.0)
Immature Granulocytes: 0 %
Lymphocytes Relative: 15 %
Lymphs Abs: 1.5 10*3/uL (ref 0.7–4.0)
MCH: 27.6 pg (ref 26.0–34.0)
MCHC: 29.7 g/dL — ABNORMAL LOW (ref 30.0–36.0)
MCV: 93 fL (ref 80.0–100.0)
Monocytes Absolute: 0.6 10*3/uL (ref 0.1–1.0)
Monocytes Relative: 6 %
Neutro Abs: 7.7 10*3/uL (ref 1.7–7.7)
Neutrophils Relative %: 76 %
Platelets: 259 10*3/uL (ref 150–400)
RBC: 4.71 MIL/uL (ref 3.87–5.11)
RDW: 16.4 % — ABNORMAL HIGH (ref 11.5–15.5)
WBC: 10.1 10*3/uL (ref 4.0–10.5)
nRBC: 0 % (ref 0.0–0.2)

## 2020-01-01 LAB — BASIC METABOLIC PANEL
Anion gap: 12 (ref 5–15)
BUN: 10 mg/dL (ref 6–20)
CO2: 18 mmol/L — ABNORMAL LOW (ref 22–32)
Calcium: 8.5 mg/dL — ABNORMAL LOW (ref 8.9–10.3)
Chloride: 108 mmol/L (ref 98–111)
Creatinine, Ser: 0.35 mg/dL — ABNORMAL LOW (ref 0.44–1.00)
GFR calc Af Amer: >60 mL/min (ref >60)
GFR calc non Af Amer: >60 mL/min (ref >60)
Glucose, Bld: 102 mg/dL — ABNORMAL HIGH (ref 70–99)
Potassium: 3.2 mmol/L — ABNORMAL LOW (ref 3.5–5.1)
Sodium: 138 mmol/L (ref 135–145)

## 2020-01-01 LAB — URINALYSIS, ROUTINE W REFLEX MICROSCOPIC
Bilirubin Urine: NEGATIVE
Glucose, UA: NEGATIVE mg/dL
Ketones, ur: 5 mg/dL — AB
Nitrite: POSITIVE — AB
Protein, ur: 30 mg/dL — AB
Specific Gravity, Urine: 1.025 (ref 1.005–1.030)
WBC, UA: >50 WBC/hpf — ABNORMAL HIGH (ref 0–5)
pH: 6 (ref 5.0–8.0)

## 2020-01-01 MED ORDER — LATANOPROST 0.005 % OP SOLN
1.0000 [drp] | Freq: Every day | OPHTHALMIC | Status: DC
  Filled 2020-01-01: qty 2.5

## 2020-01-01 MED ORDER — BACLOFEN 5 MG HALF TABLET
15.0000 mg | ORAL_TABLET | Freq: Three times a day (TID) | ORAL | Status: DC
  Administered 2020-01-01: 15 mg via ORAL
  Filled 2020-01-01: qty 1

## 2020-01-01 MED ORDER — SERTRALINE HCL 50 MG PO TABS
50.0000 mg | ORAL_TABLET | Freq: Every day | ORAL | Status: DC
  Administered 2020-01-01: 50 mg via ORAL
  Filled 2020-01-01: qty 1

## 2020-01-01 MED ORDER — CIPROFLOXACIN HCL 500 MG PO TABS: 500.0000 mg | ORAL_TABLET | Freq: Two times a day (BID) | ORAL | 0 refills | Status: AC

## 2020-01-01 MED ORDER — POLYETHYLENE GLYCOL 3350 17 G PO PACK: 17.0000 g | PACK | ORAL | Status: DC

## 2020-01-01 MED ORDER — CIPROFLOXACIN HCL 500 MG PO TABS
500.0000 mg | ORAL_TABLET | Freq: Once | ORAL | Status: AC
  Administered 2020-01-01: 500 mg via ORAL
  Filled 2020-01-01: qty 1

## 2020-01-01 MED ORDER — DORZOLAMIDE HCL-TIMOLOL MAL 2-0.5 % OP SOLN
1.0000 [drp] | Freq: Two times a day (BID) | OPHTHALMIC | Status: DC
  Filled 2020-01-01: qty 10

## 2020-01-01 MED ORDER — POTASSIUM CHLORIDE CRYS ER 20 MEQ PO TBCR
20.0000 meq | EXTENDED_RELEASE_TABLET | Freq: Once | ORAL | Status: AC
  Administered 2020-01-01: 20 meq via ORAL
  Filled 2020-01-01: qty 1

## 2020-01-01 MED ORDER — ACETAMINOPHEN 500 MG PO TABS
1000.0000 mg | ORAL_TABLET | Freq: Once | ORAL | Status: AC
  Administered 2020-01-01: 1000 mg via ORAL
  Filled 2020-01-01: qty 2

## 2020-01-01 NOTE — ED Notes (Signed)
Called [facility] for Patient Transport
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
## 2020-01-01 NOTE — ED Notes (Signed)
Social work was contacted to verify pt status to go home as well as address verification of where she is to be D/C to. Address conformed, Network engineer called [facility], caretaker was updated on the phone and advised that pt will be driven home via [place] soon. Pt is now in purple to hold until her transportation arrives, papers have been printed.
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
## 2020-01-01 NOTE — SANE Note (Signed)
The SANE/FNE Naval architect) consult has been completed. The primary or charge RN and physician have been notified. Please contact the SANE/FNE nurse on call (listed in [place]) with any further concerns.

## 2020-01-01 NOTE — ED Notes (Addendum)
PTAR here to transport pt. All d/c instructions and paperwork given to PTAR. ALL belongings - 1 bag - PTAR.

## 2020-01-01 NOTE — ED Notes (Signed)
Throughout the night, the blood pressure cuff became loose as she slept causing a low reading and when the cuff was readjusted the blood pressure was WNL. At 0650 while the patient was still sleeping, the monitor read a systolic in the low 44P and the cuff was readjusted. There was no change in blood pressure. The other arm was assessed as well with the same measurements. A manual blood pressure was obtained and documented.
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
## 2020-01-01 NOTE — ED Notes (Addendum)
Patient is resting comfortably. 

## 2020-01-01 NOTE — Progress Notes (Signed)
CSW contacted home health agencies in [place] and noted the only one that could offer service was [facility] who would have to initiate service next weekend. CSW informed legal guardian and provided [facility] with legal guardian's contact information.

## 2020-01-03 LAB — URINE CULTURE: Culture: >=100000 — AB
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
## 2020-01-04 ENCOUNTER — Telehealth: Payer: Self-pay | Admitting: *Deleted

## 2020-01-04 ENCOUNTER — Emergency Department (HOSPITAL_COMMUNITY)
Admission: EM | Admit: 2020-01-04 | Discharge: 2020-01-05 | Disposition: A | Discharge status: Home / Self Care | Payer: Medicaid Other | Attending: Emergency Medicine | Admitting: Emergency Medicine

## 2020-01-04 ENCOUNTER — Other Ambulatory Visit: Payer: Self-pay

## 2020-01-04 DIAGNOSIS — Z79899 Other long term (current) drug therapy: Secondary | ICD-10-CM | POA: Insufficient documentation

## 2020-01-04 DIAGNOSIS — F259 Schizoaffective disorder, unspecified: Secondary | ICD-10-CM | POA: Diagnosis not present

## 2020-01-04 DIAGNOSIS — F333 Major depressive disorder, recurrent, severe with psychotic symptoms: Secondary | ICD-10-CM | POA: Insufficient documentation

## 2020-01-04 DIAGNOSIS — F4310 Post-traumatic stress disorder, unspecified: Secondary | ICD-10-CM | POA: Diagnosis present

## 2020-01-04 DIAGNOSIS — N390 Urinary tract infection, site not specified: Secondary | ICD-10-CM | POA: Diagnosis not present

## 2020-01-04 DIAGNOSIS — Z20822 Contact with and (suspected) exposure to covid-19: Secondary | ICD-10-CM | POA: Diagnosis not present

## 2020-01-04 DIAGNOSIS — R45851 Suicidal ideations: Secondary | ICD-10-CM | POA: Diagnosis not present

## 2020-01-04 DIAGNOSIS — M79605 Pain in left leg: Secondary | ICD-10-CM | POA: Diagnosis present

## 2020-01-04 DIAGNOSIS — T83511A Infection and inflammatory reaction due to indwelling urethral catheter, initial encounter: Secondary | ICD-10-CM | POA: Diagnosis not present

## 2020-01-04 DIAGNOSIS — I10 Essential (primary) hypertension: Secondary | ICD-10-CM | POA: Insufficient documentation

## 2020-01-04 DIAGNOSIS — F22 Delusional disorders: Secondary | ICD-10-CM | POA: Insufficient documentation

## 2020-01-04 DIAGNOSIS — E876 Hypokalemia: Secondary | ICD-10-CM | POA: Insufficient documentation

## 2020-01-04 LAB — URINALYSIS, ROUTINE W REFLEX MICROSCOPIC
Bilirubin Urine: NEGATIVE
Glucose, UA: NEGATIVE mg/dL
Ketones, ur: 20 mg/dL — AB
Nitrite: POSITIVE — AB
Protein, ur: 30 mg/dL — AB
Specific Gravity, Urine: 1.023 (ref 1.005–1.030)
WBC, UA: >50 WBC/hpf — ABNORMAL HIGH (ref 0–5)
pH: 6 (ref 5.0–8.0)

## 2020-01-04 LAB — CBC WITH DIFFERENTIAL/PLATELET
Abs Immature Granulocytes: 0.02 10*3/uL (ref 0.00–0.07)
Basophils Absolute: 0.1 10*3/uL (ref 0.0–0.1)
Basophils Relative: 1 %
Eosinophils Absolute: 0.2 10*3/uL (ref 0.0–0.5)
Eosinophils Relative: 2 %
HCT: 39.6 % (ref 36.0–46.0)
Hemoglobin: 12.2 g/dL (ref 12.0–15.0)
Immature Granulocytes: 0 %
Lymphocytes Relative: 17 %
Lymphs Abs: 1.8 10*3/uL (ref 0.7–4.0)
MCH: 26.6 pg (ref 26.0–34.0)
MCHC: 30.8 g/dL (ref 30.0–36.0)
MCV: 86.5 fL (ref 80.0–100.0)
Monocytes Absolute: 0.7 10*3/uL (ref 0.1–1.0)
Monocytes Relative: 6 %
Neutro Abs: 7.9 10*3/uL — ABNORMAL HIGH (ref 1.7–7.7)
Neutrophils Relative %: 74 %
Platelets: 374 10*3/uL (ref 150–400)
RBC: 4.58 MIL/uL (ref 3.87–5.11)
RDW: 16.5 % — ABNORMAL HIGH (ref 11.5–15.5)
WBC: 10.6 10*3/uL — ABNORMAL HIGH (ref 4.0–10.5)
nRBC: 0 % (ref 0.0–0.2)

## 2020-01-04 LAB — COMPREHENSIVE METABOLIC PANEL
ALT: 9 U/L (ref 0–44)
AST: 10 U/L — ABNORMAL LOW (ref 15–41)
Albumin: 3.3 g/dL — ABNORMAL LOW (ref 3.5–5.0)
Alkaline Phosphatase: 90 U/L (ref 38–126)
Anion gap: 12 (ref 5–15)
BUN: 11 mg/dL (ref 6–20)
CO2: 22 mmol/L (ref 22–32)
Calcium: 8.7 mg/dL — ABNORMAL LOW (ref 8.9–10.3)
Chloride: 107 mmol/L (ref 98–111)
Creatinine, Ser: 0.33 mg/dL — ABNORMAL LOW (ref 0.44–1.00)
GFR calc Af Amer: >60 mL/min (ref >60)
GFR calc non Af Amer: >60 mL/min (ref >60)
Glucose, Bld: 103 mg/dL — ABNORMAL HIGH (ref 70–99)
Potassium: 2.7 mmol/L — CL (ref 3.5–5.1)
Sodium: 141 mmol/L (ref 135–145)
Total Bilirubin: 0.3 mg/dL (ref 0.3–1.2)
Total Protein: 8.4 g/dL — ABNORMAL HIGH (ref 6.5–8.1)

## 2020-01-04 LAB — RESPIRATORY PANEL BY RT PCR (FLU A&B, COVID)
Influenza A by PCR: NEGATIVE
Influenza B by PCR: NEGATIVE
SARS Coronavirus 2 by RT PCR: NEGATIVE

## 2020-01-04 LAB — RAPID URINE DRUG SCREEN, HOSP PERFORMED
Amphetamines: NOT DETECTED
Barbiturates: NOT DETECTED
Benzodiazepines: NOT DETECTED
Cocaine: NOT DETECTED
Opiates: NOT DETECTED
Tetrahydrocannabinol: NOT DETECTED

## 2020-01-04 LAB — ACETAMINOPHEN LEVEL: Acetaminophen (Tylenol), Serum: <10 ug/mL — ABNORMAL LOW (ref 10–30)

## 2020-01-04 LAB — I-STAT BETA HCG BLOOD, ED (MC, WL, AP ONLY): I-stat hCG, quantitative: <5 m[IU]/mL (ref <5)

## 2020-01-04 LAB — ETHANOL: Alcohol, Ethyl (B): <10 mg/dL (ref <10)

## 2020-01-04 LAB — SALICYLATE LEVEL: Salicylate Lvl: <7 mg/dL — ABNORMAL LOW (ref 7.0–30.0)

## 2020-01-04 MED ORDER — CIPROFLOXACIN IN D5W 400 MG/200ML IV SOLN
400.0000 mg | Freq: Once | INTRAVENOUS | Status: AC
  Administered 2020-01-04: 400 mg via INTRAVENOUS
  Filled 2020-01-04: qty 200

## 2020-01-04 MED ORDER — SODIUM CHLORIDE 0.9 % IV BOLUS: 1000.0000 mL | Freq: Once | INTRAVENOUS | Status: DC

## 2020-01-04 MED ORDER — TRAMADOL HCL 50 MG PO TABS: 50.0000 mg | ORAL_TABLET | Freq: Four times a day (QID) | ORAL | Status: DC | PRN

## 2020-01-04 MED ORDER — LATANOPROST 0.005 % OP SOLN
1.0000 [drp] | Freq: Every day | OPHTHALMIC | Status: DC
  Administered 2020-01-04: 1 [drp] via OPHTHALMIC
  Filled 2020-01-04: qty 2.5

## 2020-01-04 MED ORDER — MELATONIN 5 MG PO TABS
10.0000 mg | ORAL_TABLET | Freq: Every day | ORAL | Status: DC
  Administered 2020-01-04: 10 mg via ORAL
  Filled 2020-01-04: qty 2

## 2020-01-04 MED ORDER — SERTRALINE HCL 50 MG PO TABS
50.0000 mg | ORAL_TABLET | Freq: Every day | ORAL | Status: DC
  Administered 2020-01-05: 50 mg via ORAL
  Filled 2020-01-04 (×2): qty 1

## 2020-01-04 MED ORDER — POTASSIUM CHLORIDE IN NACL 20-0.9 MEQ/L-% IV SOLN
Freq: Once | INTRAVENOUS | Status: AC
  Filled 2020-01-04: qty 1000

## 2020-01-04 MED ORDER — RISPERIDONE 0.5 MG PO TABS
0.5000 mg | ORAL_TABLET | Freq: Two times a day (BID) | ORAL | Status: DC
  Administered 2020-01-04 – 2020-01-05 (×2): 0.5 mg via ORAL
  Filled 2020-01-04 (×2): qty 1

## 2020-01-04 MED ORDER — DORZOLAMIDE HCL-TIMOLOL MAL 2-0.5 % OP SOLN
1.0000 [drp] | Freq: Two times a day (BID) | OPHTHALMIC | Status: DC
  Administered 2020-01-04 – 2020-01-05 (×2): 1 [drp] via OPHTHALMIC
  Filled 2020-01-04: qty 10

## 2020-01-04 MED ORDER — POLYETHYLENE GLYCOL 3350 17 G PO PACK
17.0000 g | PACK | Freq: Every day | ORAL | Status: DC
  Administered 2020-01-05: 17 g via ORAL
  Filled 2020-01-04: qty 1

## 2020-01-04 MED ORDER — BACLOFEN 10 MG PO TABS
15.0000 mg | ORAL_TABLET | Freq: Three times a day (TID) | ORAL | Status: DC
  Administered 2020-01-04 – 2020-01-05 (×2): 15 mg via ORAL
  Filled 2020-01-04 (×2): qty 2

## 2020-01-04 MED ORDER — CIPROFLOXACIN HCL 500 MG PO TABS
500.0000 mg | ORAL_TABLET | Freq: Two times a day (BID) | ORAL | Status: DC
  Administered 2020-01-04 – 2020-01-05 (×2): 500 mg via ORAL
  Filled 2020-01-04 (×2): qty 1

## 2020-01-04 MED ORDER — CARVEDILOL 6.25 MG PO TABS
6.2500 mg | ORAL_TABLET | Freq: Two times a day (BID) | ORAL | Status: DC
  Administered 2020-01-05: 6.25 mg via ORAL
  Filled 2020-01-04 (×2): qty 1
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
## 2020-01-04 NOTE — ED Notes (Addendum)
Date and time results received: 01/03/2109:34 PM  Test: Potassium  Critical Value: 2.7  Name of Provider Notified: Vanita Panda, MD  Orders Received? Or Actions Taken?:

## 2020-01-04 NOTE — Telephone Encounter (Signed)
Post ED Visit - Positive Culture Follow-up  Culture report reviewed by antimicrobial stewardship pharmacist: [place] Team []  Elenor Quinones, Pharm.D. [x]  Heide Guile, Pharm.D., BCPS AQ-ID []  Parks Neptune, Pharm.D., BCPS []  Alycia Rossetti, Pharm.D., BCPS []  Jackson, Pharm.D., BCPS, AAHIVP []  Legrand Como, Pharm.D., BCPS, AAHIVP []  Salome Arnt, PharmD, BCPS []  Johnnette Gourd, PharmD, BCPS []  Hughes Better, PharmD, BCPS []  Leeroy Cha, PharmD []  Laqueta Linden, PharmD, BCPS []  Albertina Parr, PharmD  [place] Team []  Leodis Sias, PharmD []  Lindell Spar, PharmD []  Royetta Asal, PharmD []  Graylin Shiver, Rph []  Rema Fendt) Glennon Mac, PharmD []  Arlyn Dunning, PharmD []  Netta Cedars, PharmD []  Dia Sitter, PharmD []  Leone Haven, PharmD []  Gretta Arab, PharmD []  Theodis Shove, PharmD []  Peggyann Juba, PharmD []  Reuel Boom, PharmD   Positive urine culture Treated with Ciprofloxacin, organism sensitive to the same and no further patient follow-up is required at this time.  Harlon Flor [facility] 01/04/2020, 10:21 AM
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
## 2020-01-04 NOTE — ED Notes (Addendum)
Unable to change patient into burgundy scrubs due to the patients contractures. Pt is currently dressed in a gown. Will continue to monitor.

## 2020-01-04 NOTE — BH Assessment (Signed)
No answer on Tele-Assessment machine--will attempt BH Assessment at a later time.

## 2020-01-05 ENCOUNTER — Encounter (HOSPITAL_COMMUNITY): Payer: Self-pay | Admitting: Registered Nurse

## 2020-01-05 DIAGNOSIS — F4310 Post-traumatic stress disorder, unspecified: Secondary | ICD-10-CM

## 2020-01-05 DIAGNOSIS — F259 Schizoaffective disorder, unspecified: Secondary | ICD-10-CM

## 2020-01-05 DIAGNOSIS — T83511A Infection and inflammatory reaction due to indwelling urethral catheter, initial encounter: Secondary | ICD-10-CM

## 2020-01-05 DIAGNOSIS — F22 Delusional disorders: Secondary | ICD-10-CM | POA: Insufficient documentation

## 2020-01-05 DIAGNOSIS — N390 Urinary tract infection, site not specified: Secondary | ICD-10-CM

## 2020-01-05 MED ORDER — POTASSIUM CHLORIDE CRYS ER 20 MEQ PO TBCR
60.0000 meq | EXTENDED_RELEASE_TABLET | Freq: Once | ORAL | Status: AC
  Administered 2020-01-05: 60 meq via ORAL
  Filled 2020-01-05: qty 3
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
## 2020-01-05 NOTE — ED Notes (Signed)
Pt sleeping upon entering a short while ago to give meds. Able to get meds in then went back to sleep.

## 2020-01-05 NOTE — ED Notes (Signed)
Pt resting with eyes closed, resp even and not labored, VSS, Continue to observe until she wakes or request something.

## 2020-01-05 NOTE — BH Assessment (Signed)
Patient's caregiver notified that patient is discharged to return back to her care. The caregiver will pick patient up from the ED at 2pm. Patient's nurse made aware.
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
## 2020-01-05 NOTE — ED Notes (Signed)
Pt turned and repositioned, crackers and juice given followed by applesauce. Pt has been accepting to all treatment and thankful for the care she is receiving.
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
## 2020-01-11 ENCOUNTER — Encounter (HOSPITAL_BASED_OUTPATIENT_CLINIC_OR_DEPARTMENT_OTHER): Payer: Medicaid Other | Attending: Internal Medicine | Admitting: Internal Medicine

## 2020-01-12 ENCOUNTER — Emergency Department (HOSPITAL_BASED_OUTPATIENT_CLINIC_OR_DEPARTMENT_OTHER): Payer: Medicaid Other

## 2020-01-12 ENCOUNTER — Emergency Department (HOSPITAL_BASED_OUTPATIENT_CLINIC_OR_DEPARTMENT_OTHER)
Admission: EM | Admit: 2020-01-12 | Discharge: 2020-01-12 | Disposition: A | Discharge status: Home / Self Care | Payer: Medicaid Other | Attending: Emergency Medicine | Admitting: Emergency Medicine

## 2020-01-12 ENCOUNTER — Other Ambulatory Visit: Payer: Self-pay

## 2020-01-12 ENCOUNTER — Encounter (HOSPITAL_BASED_OUTPATIENT_CLINIC_OR_DEPARTMENT_OTHER): Payer: Self-pay | Admitting: Emergency Medicine

## 2020-01-12 DIAGNOSIS — L89159 Pressure ulcer of sacral region, unspecified stage: Secondary | ICD-10-CM | POA: Insufficient documentation

## 2020-01-12 DIAGNOSIS — N3000 Acute cystitis without hematuria: Secondary | ICD-10-CM | POA: Diagnosis not present

## 2020-01-12 DIAGNOSIS — M545 Low back pain, unspecified: Secondary | ICD-10-CM

## 2020-01-12 DIAGNOSIS — Z79899 Other long term (current) drug therapy: Secondary | ICD-10-CM | POA: Diagnosis not present

## 2020-01-12 DIAGNOSIS — Z888 Allergy status to other drugs, medicaments and biological substances status: Secondary | ICD-10-CM | POA: Insufficient documentation

## 2020-01-12 DIAGNOSIS — G809 Cerebral palsy, unspecified: Secondary | ICD-10-CM | POA: Diagnosis not present

## 2020-01-12 DIAGNOSIS — Z882 Allergy status to sulfonamides status: Secondary | ICD-10-CM | POA: Diagnosis not present

## 2020-01-12 DIAGNOSIS — Z881 Allergy status to other antibiotic agents status: Secondary | ICD-10-CM | POA: Insufficient documentation

## 2020-01-12 DIAGNOSIS — Z885 Allergy status to narcotic agent status: Secondary | ICD-10-CM | POA: Insufficient documentation

## 2020-01-12 LAB — COMPREHENSIVE METABOLIC PANEL
ALT: 8 U/L (ref 0–44)
AST: 12 U/L — ABNORMAL LOW (ref 15–41)
Albumin: 3.3 g/dL — ABNORMAL LOW (ref 3.5–5.0)
Alkaline Phosphatase: 92 U/L (ref 38–126)
Anion gap: 9 (ref 5–15)
BUN: 9 mg/dL (ref 6–20)
CO2: 23 mmol/L (ref 22–32)
Calcium: 8.5 mg/dL — ABNORMAL LOW (ref 8.9–10.3)
Chloride: 104 mmol/L (ref 98–111)
Creatinine, Ser: 0.39 mg/dL — ABNORMAL LOW (ref 0.44–1.00)
GFR calc Af Amer: >60 mL/min (ref >60)
GFR calc non Af Amer: >60 mL/min (ref >60)
Glucose, Bld: 102 mg/dL — ABNORMAL HIGH (ref 70–99)
Potassium: 2.9 mmol/L — ABNORMAL LOW (ref 3.5–5.1)
Sodium: 136 mmol/L (ref 135–145)
Total Bilirubin: 0.6 mg/dL (ref 0.3–1.2)
Total Protein: 8.3 g/dL — ABNORMAL HIGH (ref 6.5–8.1)

## 2020-01-12 LAB — URINALYSIS, MICROSCOPIC (REFLEX)

## 2020-01-12 LAB — CBC WITH DIFFERENTIAL/PLATELET
Abs Immature Granulocytes: 0.04 10*3/uL (ref 0.00–0.07)
Basophils Absolute: 0.1 10*3/uL (ref 0.0–0.1)
Basophils Relative: 1 %
Eosinophils Absolute: 0.3 10*3/uL (ref 0.0–0.5)
Eosinophils Relative: 3 %
HCT: 43.8 % (ref 36.0–46.0)
Hemoglobin: 13.5 g/dL (ref 12.0–15.0)
Immature Granulocytes: 0 %
Lymphocytes Relative: 15 %
Lymphs Abs: 1.8 10*3/uL (ref 0.7–4.0)
MCH: 27 pg (ref 26.0–34.0)
MCHC: 30.8 g/dL (ref 30.0–36.0)
MCV: 87.6 fL (ref 80.0–100.0)
Monocytes Absolute: 0.7 10*3/uL (ref 0.1–1.0)
Monocytes Relative: 6 %
Neutro Abs: 9.3 10*3/uL — ABNORMAL HIGH (ref 1.7–7.7)
Neutrophils Relative %: 75 %
Platelets: 365 10*3/uL (ref 150–400)
RBC: 5 MIL/uL (ref 3.87–5.11)
RDW: 16.8 % — ABNORMAL HIGH (ref 11.5–15.5)
WBC: 12.2 10*3/uL — ABNORMAL HIGH (ref 4.0–10.5)
nRBC: 0 % (ref 0.0–0.2)

## 2020-01-12 LAB — URINALYSIS, ROUTINE W REFLEX MICROSCOPIC
Bilirubin Urine: NEGATIVE
Glucose, UA: NEGATIVE mg/dL
Ketones, ur: 15 mg/dL — AB
Nitrite: NEGATIVE
Protein, ur: NEGATIVE mg/dL
Specific Gravity, Urine: 1.025 (ref 1.005–1.030)
pH: 6 (ref 5.0–8.0)

## 2020-01-12 MED ORDER — CEPHALEXIN 500 MG PO CAPS: 500.0000 mg | ORAL_CAPSULE | Freq: Three times a day (TID) | ORAL | 0 refills | Status: DC

## 2020-01-12 MED ORDER — SODIUM CHLORIDE 0.9 % IV SOLN
1.0000 g | Freq: Once | INTRAVENOUS | Status: AC
  Administered 2020-01-12: 1 g via INTRAVENOUS
  Filled 2020-01-12: qty 10
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
## 2020-01-12 NOTE — ED Notes (Signed)
Assisted MD with exam of sacral wound. Pt tolerated well.

## 2020-01-12 NOTE — Discharge Instructions (Signed)
Take Keflex as prescribed today.  Follow-up with primary doctor/social services if you desire a change in her living arrangements.
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
## 2020-01-12 NOTE — ED Triage Notes (Signed)
Per Patient, she states that she has been having back pain for two weeks.  She states she had a UTI and that the doctor prescribed antibiotics.  She relates she is not taking them because they contain fluoride and she is allergic to medications with fluoride.  Pt has foley catheter in place, as well as colostomy bag.  Pt lives in alternative family living.  She has caregivers.

## 2020-01-12 NOTE — ED Triage Notes (Signed)
Per EMS:  Pt c/o pain all over.  Family states she is not caring for herself:  Poor intake, not taking antibiotics for UTI.

## 2020-01-12 NOTE — ED Notes (Signed)
Spoke with care provider [name] and informed pt will be discharged . Awaiting PTAR for transport.

## 2020-01-13 LAB — URINE CULTURE: Culture: 90000 — AB

## 2020-01-28 ENCOUNTER — Encounter (HOSPITAL_COMMUNITY): Payer: Self-pay | Admitting: *Deleted

## 2020-01-28 ENCOUNTER — Other Ambulatory Visit: Payer: Self-pay

## 2020-01-28 ENCOUNTER — Emergency Department (HOSPITAL_COMMUNITY)
Admission: EM | Admit: 2020-01-28 | Discharge: 2020-02-04 | Disposition: A | Discharge status: Home / Self Care | Payer: Medicaid Other | Attending: Emergency Medicine | Admitting: Emergency Medicine

## 2020-01-28 DIAGNOSIS — R45851 Suicidal ideations: Secondary | ICD-10-CM | POA: Diagnosis not present

## 2020-01-28 DIAGNOSIS — G8250 Quadriplegia, unspecified: Secondary | ICD-10-CM | POA: Diagnosis not present

## 2020-01-28 DIAGNOSIS — G809 Cerebral palsy, unspecified: Secondary | ICD-10-CM | POA: Insufficient documentation

## 2020-01-28 DIAGNOSIS — I10 Essential (primary) hypertension: Secondary | ICD-10-CM | POA: Diagnosis not present

## 2020-01-28 DIAGNOSIS — Z20822 Contact with and (suspected) exposure to covid-19: Secondary | ICD-10-CM | POA: Insufficient documentation

## 2020-01-28 DIAGNOSIS — M545 Low back pain, unspecified: Secondary | ICD-10-CM

## 2020-01-28 DIAGNOSIS — G894 Chronic pain syndrome: Secondary | ICD-10-CM | POA: Diagnosis not present

## 2020-01-28 DIAGNOSIS — N390 Urinary tract infection, site not specified: Secondary | ICD-10-CM | POA: Diagnosis present

## 2020-01-28 DIAGNOSIS — G8929 Other chronic pain: Secondary | ICD-10-CM

## 2020-01-28 LAB — BASIC METABOLIC PANEL
Anion gap: 11 (ref 5–15)
BUN: 10 mg/dL (ref 6–20)
CO2: 22 mmol/L (ref 22–32)
Calcium: 8.6 mg/dL — ABNORMAL LOW (ref 8.9–10.3)
Chloride: 107 mmol/L (ref 98–111)
Creatinine, Ser: 0.34 mg/dL — ABNORMAL LOW (ref 0.44–1.00)
GFR calc Af Amer: >60 mL/min (ref >60)
GFR calc non Af Amer: >60 mL/min (ref >60)
Glucose, Bld: 89 mg/dL (ref 70–99)
Potassium: 3 mmol/L — ABNORMAL LOW (ref 3.5–5.1)
Sodium: 140 mmol/L (ref 135–145)

## 2020-01-28 LAB — CBC WITH DIFFERENTIAL/PLATELET
Abs Immature Granulocytes: 0.04 10*3/uL (ref 0.00–0.07)
Basophils Absolute: 0.1 10*3/uL (ref 0.0–0.1)
Basophils Relative: 1 %
Eosinophils Absolute: 0.3 10*3/uL (ref 0.0–0.5)
Eosinophils Relative: 3 %
HCT: 40.2 % (ref 36.0–46.0)
Hemoglobin: 12.2 g/dL (ref 12.0–15.0)
Immature Granulocytes: 0 %
Lymphocytes Relative: 15 %
Lymphs Abs: 1.5 10*3/uL (ref 0.7–4.0)
MCH: 27.4 pg (ref 26.0–34.0)
MCHC: 30.3 g/dL (ref 30.0–36.0)
MCV: 90.1 fL (ref 80.0–100.0)
Monocytes Absolute: 0.4 10*3/uL (ref 0.1–1.0)
Monocytes Relative: 4 %
Neutro Abs: 7.7 10*3/uL (ref 1.7–7.7)
Neutrophils Relative %: 77 %
Platelets: 301 10*3/uL (ref 150–400)
RBC: 4.46 MIL/uL (ref 3.87–5.11)
RDW: 17.4 % — ABNORMAL HIGH (ref 11.5–15.5)
WBC: 10 10*3/uL (ref 4.0–10.5)
nRBC: 0 % (ref 0.0–0.2)

## 2020-01-28 MED ORDER — SODIUM CHLORIDE 0.9 % IV SOLN: INTRAVENOUS | Status: DC

## 2020-01-28 NOTE — ED Provider Notes (Signed)
[facility] DEPT Provider Note   CSN: [id number] Arrival date & time: 01/28/20  2050     History Chief Complaint  Patient presents with  . Back Pain  . Urinary Tract Infection    Pamela Fuller is a 39 y.o. female.  HPI Patient reports she is having back pain.  She reports has pain all over her lower back and into her legs.  This is been going on for 3 weeks.  She reports she did have an injury to her left leg and is getting wraps but should be better by now.  She is not sure why she still having pain.  Is aching in quality and generalized.  No fevers chills or vomiting.  She denies she is taking any pain medication for it.  After completing the exam and reviewing labs and telling the patient that the plan would be for tramadol for pain, she advises that she does not want to stay at her group home and that she has syphilis and wants to be treated for that.    Past Medical History:  Diagnosis Date  . Cerebral palsy [facility])     Patient Active Problem List   Diagnosis Date Noted  . Delusions ([place])   . Acute pain of right knee 11/11/2019  . Closed fracture of distal end of femur with routine healing, subsequent encounter 11/11/2019  . Iron deficiency anemia 09/01/2019  . Benign essential hypertension 08/31/2019  . Cerebral palsy ([place]) 08/31/2019  . Chronic suprapubic catheter ([place]) 08/31/2019  . 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 PTSD (post-traumatic stress disorder) 08/31/2019  . Sacral decubitus ulcer, stage IV ([place]) 08/31/2019  . Schizoaffective disorder ([place]) 08/31/2019  . Sepsis due to urinary tract infection ([place]) 07/18/2018  . Pain management contract signed 07/17/2018  . Celiac disease 07/03/2018  . IBS (irritable bowel syndrome) 07/03/2018  . Bladder stones 02/13/2018  . Flank pain 02/13/2018  . Acute cystitis with hematuria 10/31/2017  . Fecal impaction of colon ([place]) 03/24/2017  . Left lower quadrant pain 03/24/2017  .  Severe protein-calorie malnutrition ([place]) 09/24/2016  . Acute constipation 08/20/2016  . Long term (current) use of antibiotics 07/26/2016  . Breakdown (mechanical) of cystostomy catheter, initial encounter ([place]) 06/09/2016  . Chronic left-sided low back pain with sciatica 08/08/2015  . Disorder of urethral catheter ([place]) 06/28/2015  . Generalized abdominal pain 04/20/2015  . Non-intractable vomiting with nausea 04/20/2015  . Urinary tract infection associated with catheterization of urinary tract, initial encounter ([place]) 03/20/2015  . Decubitus ulcer of lower back 02/13/2015  . Bowel incontinence 01/16/2015  . Chronic constipation 01/16/2015  . 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 Chronic sciatica 05/19/2014  . Calculus of kidney 08/16/2013  . History of recurrent UTIs 08/16/2013  . Neurogenic bladder 08/16/2013  . Urine retention 05/13/2013  . Scoliosis 07/16/2012    History reviewed. No pertinent surgical history.   OB History   No obstetric history on file.     No family history on file.  Social History   Tobacco Use  . Smoking status: Never Smoker  . Smokeless tobacco: Never Used  Substance Use Topics  . Alcohol use: Never  . Drug use: Never    Home Medications Prior to Admission medications   Medication Sig Start Date End Date Taking? Authorizing Provider  baclofen (LIORESAL) 10 MG tablet Take 1.5 tablets (15 mg total) by mouth 3 (three) times daily. 10/03/19   Robinson, [place] N, PA-C  carvedilol (COREG) 6.25 MG tablet Take  6.25 mg by mouth 2 (two) times daily with a meal.    [provider]  cephALEXin (KEFLEX) 500 MG capsule Take 1 capsule (500 mg total) by mouth 3 (three) times daily. 01/12/20   Veryl Speak, MD  dorzolamide-timolol (COSOPT) 22.3-6.8 MG/ML ophthalmic solution Place 1 drop into both eyes 2 (two) times daily.    [provider]  Nyoka Cowden Tea 250 MG CAPS Take 250 mg by mouth in the morning and at bedtime.    [provider]  Javier Docker Oil 1000 MG CAPS Take by mouth.    [provider]  latanoprost (XALATAN) 0.005 % ophthalmic solution Place 1 drop into both eyes at bedtime.    [provider]  Melatonin 10 MG TABS Take 10 mg by mouth at bedtime.    [provider]  Polyethylene Glycol 3350 (PEG 3350) 17 GM/SCOOP POWD Take 17 g by mouth daily.     [provider]  Probiotic Product (PROBIOTIC-10 PO) Take 500 mg by mouth daily.    [provider]  risperiDONE (RISPERDAL) 0.25 MG tablet Take 0.5 mg by mouth 2 (two) times daily.    [provider]  sertraline (ZOLOFT) 50 MG tablet Take 50 mg by mouth daily.    [provider]  traMADol (ULTRAM) 50 MG tablet Take 50 mg by mouth every 6 (six) hours as needed for moderate pain.    [provider]    Allergies    Levaquin [levofloxacin], Codeine, Docusate, Quinolones, and Sulfa antibiotics  Review of Systems   Review of Systems 10 Systems reviewed and are negative for acute change except as noted in the HPI.  Physical Exam Updated Vital Signs BP 115/82   Pulse (!) 102   Temp 97.9 F (36.6 C) (Oral)   Resp 16   Ht 5' 7"  (1.702 m)   Wt 72.6 kg   LMP 01/28/2020   SpO2 100%   BMI 25.06 kg/m   Physical Exam Constitutional:      Comments: Patient is alert and nontoxic.  No respiratory distress.  HENT:     Mouth/Throat:     Mouth: Mucous membranes are moist.     Pharynx: Oropharynx is clear.  Eyes:     Extraocular Movements: Extraocular movements intact.  Cardiovascular:     Rate and Rhythm: Normal rate and regular rhythm.  Pulmonary:     Effort: Pulmonary effort is normal.     Breath sounds: Normal breath sounds.  Abdominal:     General: There is no distension.     Palpations: Abdomen is soft.     Tenderness: There is no abdominal tenderness. There is no guarding.     Comments: Suprapubic catheter in good condition.  Musculoskeletal:     Comments: Chronic  contractures of the lower extremities.  Patient does have an Ortho-Glass splint on the posterior aspect of the left lower extremity.  Foot is warm and dry.  Perfusion is good.  No erythema or swelling above the splint.  Neurological:     Comments: Patient's limitations of cerebral palsy are for significant immobility.  Psychiatric:        Mood and Affect: Mood normal.     ED Results / Procedures / Treatments   Labs (all labs ordered are listed, but only abnormal results are displayed) Labs Reviewed  CBC WITH DIFFERENTIAL/PLATELET - Abnormal; Notable for the following components:      Result Value   RDW 17.4 (*)    All other components  within normal limits  BASIC METABOLIC PANEL - Abnormal; Notable for the following components:   Potassium 3.0 (*)    Creatinine, Ser 0.34 (*)    Calcium 8.6 (*)    All other components within normal limits  URINE CULTURE  URINALYSIS, ROUTINE W REFLEX MICROSCOPIC    EKG None  Radiology No results found.  Procedures Procedures (including critical care time)  Medications Ordered in ED Medications  0.9 %  sodium chloride infusion ( Intravenous Rate/Dose Verify 01/28/20 2235)    ED Course  I have reviewed the triage vital signs and the nursing notes.  Pertinent labs & imaging results that were available during my care of the patient were reviewed by me and considered in my medical decision making (see chart for details).    MDM Rules/Calculators/A&P                      Patient presents with complaint of back pain.  Patient is nonambulatory at baseline.  Pain has been present for over 3 weeks.  Rolling the patient did not seem to cause significant discomfort.  No acute findings noted to palpation of the back.  At this time will provide tramadol for pain control.  Patient secondary complaint was wishing to change group homes and get treated for syphilis.  This has been a recurrent situation with the patient and multiple evaluations and social  work consults have been made in the past.  This time I do not see indication to pursue further evaluation of this issue. Final Clinical Impression(s) / ED Diagnoses Final diagnoses:  Chronic midline low back pain without sciatica    Rx / DC Orders ED Discharge Orders    None       Charlesetta Shanks, MD 01/29/20 [phone]

## 2020-01-28 NOTE — ED Triage Notes (Signed)
Pt BIB EMS and presents from home.  Pt states that she had a UTI for about a week. Pt c/o back pain that started with her UTI.  Pt has been noncompliant with antibiotics along with her regular meds. EMS also reports decreased oral intake for the past 2-3 days. Pt has foley with dark amber urine. Hx cerebral palsy, ALS, and schizophrenia. Pt a/o x 4.

## 2020-01-29 LAB — URINALYSIS, ROUTINE W REFLEX MICROSCOPIC
Bilirubin Urine: NEGATIVE
Glucose, UA: NEGATIVE mg/dL
Ketones, ur: 80 mg/dL — AB
Nitrite: POSITIVE — AB
Protein, ur: 100 mg/dL — AB
Specific Gravity, Urine: 1.02 (ref 1.005–1.030)
WBC, UA: >50 WBC/hpf — ABNORMAL HIGH (ref 0–5)
pH: 5 (ref 5.0–8.0)

## 2020-01-29 MED ORDER — BACLOFEN 10 MG PO TABS
15.0000 mg | ORAL_TABLET | Freq: Three times a day (TID) | ORAL | Status: DC
  Administered 2020-01-29 – 2020-02-04 (×19): 15 mg via ORAL
  Filled 2020-01-29 (×20): qty 2

## 2020-01-29 MED ORDER — CEPHALEXIN 500 MG PO CAPS
500.0000 mg | ORAL_CAPSULE | Freq: Three times a day (TID) | ORAL | Status: DC
  Administered 2020-01-29 – 2020-02-04 (×19): 500 mg via ORAL
  Filled 2020-01-29 (×19): qty 1

## 2020-01-29 MED ORDER — RISPERIDONE 0.5 MG PO TABS
0.5000 mg | ORAL_TABLET | Freq: Two times a day (BID) | ORAL | Status: DC
  Administered 2020-01-30 – 2020-02-04 (×11): 0.5 mg via ORAL
  Filled 2020-01-29 (×13): qty 1

## 2020-01-29 MED ORDER — CARVEDILOL 6.25 MG PO TABS
6.2500 mg | ORAL_TABLET | Freq: Two times a day (BID) | ORAL | Status: DC
  Administered 2020-01-29 – 2020-02-01 (×4): 6.25 mg via ORAL
  Filled 2020-01-29 (×16): qty 1

## 2020-01-29 MED ORDER — SERTRALINE HCL 50 MG PO TABS
50.0000 mg | ORAL_TABLET | Freq: Every day | ORAL | Status: DC
  Administered 2020-01-29 – 2020-02-04 (×7): 50 mg via ORAL
  Filled 2020-01-29 (×8): qty 1

## 2020-01-29 MED ORDER — POLYETHYLENE GLYCOL 3350 17 G PO PACK
17.0000 g | PACK | Freq: Every day | ORAL | Status: DC
  Administered 2020-01-29 – 2020-02-04 (×5): 17 g via ORAL
  Filled 2020-01-29 (×7): qty 1

## 2020-01-29 MED ORDER — DORZOLAMIDE HCL-TIMOLOL MAL 2-0.5 % OP SOLN
1.0000 [drp] | Freq: Two times a day (BID) | OPHTHALMIC | Status: DC
  Administered 2020-01-31 – 2020-02-04 (×9): 1 [drp] via OPHTHALMIC
  Filled 2020-01-29 (×2): qty 10

## 2020-01-29 MED ORDER — LATANOPROST 0.005 % OP SOLN
1.0000 [drp] | Freq: Every day | OPHTHALMIC | Status: DC
  Administered 2020-02-01 – 2020-02-03 (×3): 1 [drp] via OPHTHALMIC
  Filled 2020-01-29 (×2): qty 2.5

## 2020-01-29 MED ORDER — MELATONIN 5 MG PO TABS
10.0000 mg | ORAL_TABLET | Freq: Every day | ORAL | Status: DC
  Administered 2020-01-30 – 2020-02-03 (×5): 10 mg via ORAL
  Filled 2020-01-29 (×6): qty 2

## 2020-01-29 MED ORDER — TRAMADOL HCL 50 MG PO TABS
50.0000 mg | ORAL_TABLET | Freq: Four times a day (QID) | ORAL | Status: DC | PRN
  Administered 2020-01-31 – 2020-02-01 (×2): 50 mg via ORAL
  Filled 2020-01-29 (×3): qty 1

## 2020-01-29 MED ORDER — TRAMADOL HCL 50 MG PO TABS
50.0000 mg | ORAL_TABLET | Freq: Once | ORAL | Status: AC
  Administered 2020-01-29: 50 mg via ORAL
  Filled 2020-01-29: qty 1

## 2020-01-29 NOTE — ED Notes (Signed)
Pt provided with dinner tray and offered assistance. Pt denies being hungry at this time.

## 2020-01-29 NOTE — ED Notes (Signed)
Patient declined breakfast tray at this time.

## 2020-01-29 NOTE — ED Notes (Addendum)
Pt refusing to leave the Ed via ptar States if you make her she will killer self.   Called guardian via [place] left message  Wayna Chalet  [phone]-  ----------  Called dorothy frye care giving supervisor  [phone]-  Called p. Hicks caregiver/   informed no longer welcome at [facility]F living home at this time per her guardian. Spoke with Woodfin Ganja supervisor, to be researched by social work in the morning, pt kept at ED. Charge and provider notified.   -----------   Called home: spoke with care giver told she no longer welcome back at her home by guardian. [facility] living.
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
## 2020-01-29 NOTE — ED Notes (Signed)
Spoke with attending provider. Plan to consult social work in the AM- No [place] orders at this time.

## 2020-01-29 NOTE — ED Notes (Signed)
Called DSS at [phone]. Waiting on return call.

## 2020-01-29 NOTE — ED Notes (Signed)
Darryl from DSS took report of patient not able to send to the facility because facility is refusing her. Darryl will call back.

## 2020-01-29 NOTE — ED Provider Notes (Signed)
At time of discharge, patient reported she was suicidal.  Group home was refusing to take patient back. Social work consult placed   Ripley Fraise, MD 01/29/20 [phone]

## 2020-01-29 NOTE — ED Provider Notes (Signed)
Pt here from group home.  Per notes, group home is refusing to take her back.  Social work consult placed.     Pamela Rank, MD 01/29/20 [phone]

## 2020-01-29 NOTE — ED Notes (Signed)
Rounded on pt to assess pts needed, pt states she is fine at this time. Pt resting comfortably and watching TV

## 2020-01-29 NOTE — ED Notes (Signed)
Disposed of pts food tray, pt states she was finished. Pt readjusted in bed. Will continue to monitor

## 2020-01-29 NOTE — ED Notes (Signed)
Patient given lunch tray. States she is not hungry at this time. Patient agreeable to eat pack of crackers and gingerale.

## 2020-01-30 LAB — URINE CULTURE

## 2020-01-30 NOTE — ED Notes (Signed)
Pt ate all of lunch tray

## 2020-01-30 NOTE — ED Notes (Signed)
Provided pt with hospital bed to increase pt comfort

## 2020-01-30 NOTE — ED Notes (Signed)
This writer helped patient to eat her dinner

## 2020-01-31 ENCOUNTER — Encounter (HOSPITAL_BASED_OUTPATIENT_CLINIC_OR_DEPARTMENT_OTHER): Payer: Medicaid Other | Admitting: Internal Medicine

## 2020-01-31 NOTE — ED Notes (Signed)
Pt fed lunch with good appetite

## 2020-01-31 NOTE — ED Notes (Signed)
Pt has good appetite

## 2020-01-31 NOTE — ED Notes (Signed)
Fed her sandwich and a cup of soda. She took all her HS meds without difficulty. Catheter bag emptied. She expressed her thanks often. States she is here because the place she is living at the woman there caring for her "hits me daily".

## 2020-01-31 NOTE — Progress Notes (Signed)
CSW spoke with patient's guardian Wayna Chalet to follow up on patient's placement. Nira Conn reports Venetia Constable will be doing walk through of need AFL today. She reports patient's case/placement has been expedited to be placed ASAP. Nira Conn reports she will follow up with CSW as things continue to progress.   Golden Circle, LCSW Transitions of Care Department [facility] ED [phone]

## 2020-01-31 NOTE — ED Notes (Signed)
Complained of all over body pain which she said is fairly normal for her. She agreed to take something for pain. Tramadol given as ordered for pain.

## 2020-01-31 NOTE — ED Notes (Signed)
Pt has been seen by wound care/ostomy nurses with appropriate dressing changes. Pt has been repositioned every 2 hours with pillows when she will allow it but she does not like pillows under her, especially  Her feet. Would not allow barrier dressing on heels.

## 2020-01-31 NOTE — ED Notes (Signed)
Repositioned her in bed with tech help. 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 She is pleasant on initial contact and was cooperative with her COVID test. No complaints voiced. She accepted offer of fluids and drank two containers of OJ.

## 2020-01-31 NOTE — ED Notes (Signed)
Scant amount of loose stool in ostomy bag.  Small amount of urine in Foley.

## 2020-01-31 NOTE — ED Notes (Signed)
HOB Semi Flowler's.

## 2020-01-31 NOTE — ED Notes (Signed)
Pt given bed bath. Sacral decub full thickness, (can put fist in it).  Applied wet-to-dry dressing with sacral protective cover on top. Pt would not allow heal protection. Entire perineal area breaking down with rash. This Probation officer ordered a wound care consult.

## 2020-01-31 NOTE — Consult Note (Signed)
[place] Nurse Consult Note: Reason for Consult: Stage 3 sacral pressure injury, present on admission.  Wound type:stage 3 PI Pressure Injury POA: Yes Measurement: 3 cm x 5 cm x 3 cm (undermining at 9 o'clock)  Wound bed:red with fibrin present Drainage (amount, consistency, odor) minimal serosanguinous  No odor.  Periwound:intact Dressing procedure/placement/frequency: Cleanse sacrococcygeal wound with NS and pat dry. Fill indead space with alginate dressing (LAWSON #[id number]).  Cover with dry gauze and ABD pad/tape.  Change daily.      [place] Nurse ostomy consult note Stoma type/location: LUQ colostomy Stomal assessment/size: 1 5/8" Peristomal assessment: intact Treatment options for stomal/peristomal skin: barrier ring and 2 piece 2 3/4" pouch Output scant liquid brown stool Ostomy pouching: 2pc. Pouch.   Education provided: None Enrolled patient in [place] program: No NA  Will not follow at this time.  Please re-consult if needed.  Domenic Moras MSN, RN, FNP-BC CWON Wound, Ostomy, Continence Nurse Pager [phone]

## 2020-02-01 LAB — SARS CORONAVIRUS 2 (TAT 6-24 HRS): SARS Coronavirus 2: NEGATIVE

## 2020-02-01 NOTE — Progress Notes (Addendum)
2nd shift ED CSW received a "text" message from pt's DS Legal Guardian Wayna Chalet who replied to the CSW's request for a return phone call via text saying only that, "Hey! This is Water quality scientist. Miyonna will be admitting into her new AFL on Friday".  CSW responded with:  "Hello Miss Ishmael Holter, this is the 2nd shift ED CSW.  Who is the [facility] contact assisting with the pt's case, please and who will be picking the pt up today?".  CSW did not receive a reply.    CSW will continue to follow for D/C needs.  Alphonse Guild. Valissa Lyvers  MSW, LCSW, LCAS, CSI Transitions of Care Clinical Social Worker Care Coordination Department Ph: [phone]

## 2020-02-01 NOTE — Progress Notes (Signed)
CSW attempted to contact patient's guardian x2, did not receive an answer. Will continue to follow up.   Golden Circle, LCSW Transitions of Care Department [facility] ED [phone]

## 2020-02-01 NOTE — Progress Notes (Signed)
CSW made an attempt (third in one day) to contact pt's [place] at ph: [phone] in order to seek pt's contact info for the pt's [facility] service rep in this area.  CSW was unable to reach [facility] and is awaiting her return call.  CSW will continue to follow for D/C needs.  Alphonse Guild. Kimoni Pagliarulo  MSW, LCSW, LCAS, CSI Transitions of Care Clinical Social Worker Care Coordination Department Ph: [phone]

## 2020-02-01 NOTE — Progress Notes (Signed)
Received Pamela Fuller at the change of shift awake in her bed watching the TV, later she was changed, peri care performed, turned and given her night time medications. She drained 400 cc of concentrated urine. She was repositioned throughout the night as as she slept.

## 2020-02-01 NOTE — Progress Notes (Addendum)
CSW received a 2nd message again stating only that the pt will be admitted into the pt's new AFL on Friday.  CSW asserted that the pt staying in the ED until Friday is not acceptable for two reasons:  1.  Beds must be available for emergergeny rather then be used as housing options and   2. Pt's best care and safety beyond the need for emergency care is not found within the confines of the emergency dept.  CSW received the reply from the pt's LG that she is on vacation and as such she has no answers and asked if the CSW would like the contact info of the DSS worker providing coverage for Pamela Fuller's clients.  CSW also insisted on receiving the name of Pamela Fuller which was provided with the request that the person providing coverage Pamela Fuller) be contacted before the Fuller.  CSW stated that the CSW's [facility] Fuller is requesting that the CSW receive from Pamela Fuller the contact info listed below:  CSW received a message back from pt's DSS LG Pamela Fuller who stated she is out of town.  Pamela Fuller stated that coverage for her this week will be Pamela Fuller at  [phone] and that Pamela Fuller's Fuller is Pamela Fuller at ph: [phone].  Per Pamela Fuller the pt's Care Coordinator is Pamela Fuller at [place] at ph: [phone].  CSW Leadership was updated.  2nd shift ED CSW will leave handoff for 1st shift ED CSW.  CSW will continue to follow for D/C needs.  Alphonse Guild. Pamela Fuller  MSW, LCSW, LCAS, CSI Transitions of Care Clinical Social Worker Care Coordination Department Ph: [phone]

## 2020-02-02 MED ORDER — ACETAMINOPHEN 500 MG PO TABS
1000.0000 mg | ORAL_TABLET | Freq: Once | ORAL | Status: AC
  Administered 2020-02-02: 1000 mg via ORAL
  Filled 2020-02-02: qty 2

## 2020-02-02 NOTE — ED Notes (Signed)
Patient ate full lunch tray. Good appetite.

## 2020-02-02 NOTE — Progress Notes (Signed)
Received Pamela Fuller this PM in her room asleep. Later she was assessed and turned to her left side. She was compliant with her medications. She was turned throughout the night and given fluids this AM. Her SP foley drained 250 cc of concentrated urine.

## 2020-02-02 NOTE — ED Notes (Signed)
Patient ate full dinner tray.

## 2020-02-02 NOTE — ED Notes (Signed)
Called [employer] to get Alginate dressing required for dressing change.

## 2020-02-02 NOTE — Progress Notes (Signed)
CSW spoke with patient Care Coordinator Yvonne Kendall [phone]) regarding an update on patient's placement into a new AFL. Per Roselee Nova continues to work on this case and expedite the process to get patient transferred on Friday. At this time in the process they are working to get the necessary paperwork in order with Medicaid/the State. CSW will continue to follow up with [name] to ensure no barriers arise.   Golden Circle, LCSW Transitions of Care Department [facility] ED [phone]

## 2020-02-02 NOTE — ED Notes (Addendum)
Face and hands washed, hair combed, mouth care provided. Pt turned onto left side. Alert and grateful for the care provided.

## 2020-02-02 NOTE — ED Notes (Signed)
Spoke with Pamela Fuller at [employer] to please bring the Alginate dressing.

## 2020-02-02 NOTE — ED Provider Notes (Signed)
Emergency Medicine Observation Re-evaluation Note  Pamela Fuller is a 39 y.o. female, seen on rounds today.  Pt initially presented to the ED for complaints of Back Pain and Urinary Tract Infection Currently, the patient is waiting for placement.  The patient stated that she was suicidal and refused to go back to her group home upon discharge.  Social work working on placement currently.  Patient's last blood pressure was low and that was while she was asleep on her side.  Will have staff recheck.  Patient states she has a mild headache.  States she has been eating and drinking.  Physical Exam  BP (!) 78/55 (BP Location: Right Arm)   Pulse 63   Temp 97.7 F (36.5 C) (Oral)   Resp 20   Ht 5' 7"  (1.702 m)   Wt 72.6 kg   LMP 01/28/2020   SpO2 93%   BMI 25.06 kg/m  Physical Exam Vitals and nursing note reviewed.  Constitutional:      General: She is not in acute distress.    Appearance: She is well-developed. She is not diaphoretic.  HENT:     Head: Normocephalic and atraumatic.  Eyes:     Pupils: Pupils are equal, round, and reactive to light.  Cardiovascular:     Rate and Rhythm: Normal rate and regular rhythm.     Heart sounds: No murmur. No friction rub. No gallop.   Pulmonary:     Effort: Pulmonary effort is normal.     Breath sounds: No wheezing or rales.  Abdominal:     General: There is no distension.     Palpations: Abdomen is soft.     Tenderness: There is no abdominal tenderness.  Musculoskeletal:        General: No tenderness.     Cervical back: Normal range of motion and neck supple.  Skin:    General: Skin is warm and dry.  Neurological:     Mental Status: She is alert.     Comments: Asleep, awakes to voice     ED Course / MDM  EKG:    I have reviewed the labs performed to date as well as medications administered while in observation.  Recent changes in the last 24 hours include none. Plan  Current plan is for awaiting placement. Patient is not under  full IVC at this time.   Deno Etienne, DO 02/02/20 [phone]

## 2020-02-02 NOTE — ED Notes (Signed)
Pt repositioned q 2 hours today and offered fluids. Bath given and sacral dressing changed. Menstrual period appears to have stopped.

## 2020-02-03 NOTE — Progress Notes (Signed)
CSW received a call from the DSS agent Devoria Albe who is providing coverage this week for the pt's Legal Guardian Wayna Chalet.  Miss Ouida Sills stated she would call the 1st shift ED CSW on 02/04/20 to advise she has no information to offer on pt's prior medical care and maintenance regimen should this be needed to set up [facility] for the pt at D/C.  2nd shift ED CSW will leave handoff for 1st shift ED CSW.  CSW will continue to follow for D/C needs.  Alphonse Guild. Paiton Fosco  MSW, LCSW, LCAS, CSI Transitions of Care Clinical Social Worker Care Coordination Department Ph: [phone]

## 2020-02-03 NOTE — Progress Notes (Signed)
CSW received called from [facility] with [name]. [name] reports patient will be going to [facility] and the contact person is Tilman Neat [phone]). [name] reports that patient will need to be transported via [place] and requested CSW to reach out to Mr. Tamala Julian regarding time and address to send patient.   CSW spoke with Elta Guadeloupe who reports patient can come at [address] [place], [place] [address].   Golden Circle, LCSW Transitions of Care Department [facility] ED [phone]

## 2020-02-03 NOTE — Progress Notes (Addendum)
CSW received a call from [place] who was requesting to know the particulars of how to care for the pt and pt's care in regards to the pt's colostomy bag, and it's maintenance and other issues.  The call was facilitated with the [facility] ED RN CM assuring Elta Guadeloupe QP that the 1st shift ED CSW and the [facility] ED RN CM will ensure this education is provided to the person of [facility]'s choosing on 02/04/20 and that the RN CM will follow up.  Mark with [facility] at ph: [phone] asks that the 1st shift ED CSW speak with him tomorrow to facilitate the pt coming.  CSW will continue to follow for D/C needs.  Alphonse Guild. Romaine Maciolek  MSW, LCSW, LCAS, CSI Transitions of Care Clinical Social Worker Care Coordination Department Ph: [phone]

## 2020-02-04 NOTE — TOC Initial Note (Addendum)
Transition of Care [facility]) - Initial/Assessment Note    Patient Details  Name: Pamela Fuller MRN: [id number] Date of Birth: Apr 10, 1981  Transition of Care [facility]) CM/SW Contact:    [name], RN Phone Number: [phone] 02/04/2020, 2:20 PM  Clinical Narrative:                 TOC CM spoke to pt at bedside. Contacted Wound Care RN, Melody to assist with getting forms completed for [facility] for Ostomy and Wound Care. Faxed completed forms to [facility]. Will send copy to AFL home. Spoke to [employer], [employer], Fax [phone], # [phone]. States they have a Hydrographic surveyor that could possible do teaching for Ostomy and Wound Care. Explained pt will come with supplies for wound and ostomy and order forms have been faxed to [facility]. Attempted to locate a [facility] to assist. Director states the other AFL home will bring her hospital bed, hoyer lift and van today.   [facility]-declined [facility]-declined [facility]declined [facility] [employer] confirmation  02/04/2020 530 pm [facility] accepted referral for [facility].  Expected Discharge Plan: Group Home Barriers to Discharge: No Barriers Identified   Patient Goals and CMS Choice        Expected Discharge Plan and Services Expected Discharge Plan: Group Home In-house Referral: Clinical Social Work Discharge Planning Services: CM Consult   Living arrangements for the past 2 months: Group Home                                      Prior Living Arrangements/Services Living arrangements for the past 2 months: Group Home Lives with:: Facility Resident   Do you feel safe going back to the place where you live?: Yes      Need for Family Participation in Patient Care: Yes (Comment) Care giver support system in place?: Yes (comment) Current home services: DME(hospital bed, hoyer lift, Lucianne Lei) Criminal Activity/Legal Involvement Pertinent to Current Situation/Hospitalization: No - Comment as needed  Activities of Daily  Living      Permission Sought/Granted Permission sought to share information with : Case Manager, Customer service manager, PCP Permission granted to share information with : Yes, Verbal Permission Granted  Share Information with NAME: Wayna Chalet  Permission granted to share info w AGENCY: [facility], Group [facility], [place] granted to share info w Relationship: legal guardian  Permission granted to share info w Contact Information: [phone]  Emotional Assessment Appearance:: Appears stated age Attitude/Demeanor/Rapport: Engaged Affect (typically observed): Accepting Orientation: : Oriented to Self, Oriented to Place, Oriented to  Time, Oriented to Situation   Psych Involvement: No (comment)  Admission diagnosis:  back pain, UTI, palpatations Patient Active Problem List   Diagnosis Date Noted  . Delusions ([place])   . Acute pain of right knee 11/11/2019  . Closed fracture of distal end of femur with routine healing, subsequent encounter 11/11/2019  . Iron deficiency anemia 09/01/2019  . Benign essential hypertension 08/31/2019  . Cerebral palsy ([place]) 08/31/2019  . 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 PTSD (post-traumatic stress disorder) 08/31/2019  . Sacral decubitus ulcer, stage IV ([place]) 08/31/2019  . Schizoaffective disorder ([place]) 08/31/2019  . Sepsis due to urinary tract infection ([place]) 07/18/2018  . Pain management contract signed 07/17/2018  . Celiac disease 07/03/2018  . IBS (irritable bowel syndrome) 07/03/2018  . Bladder stones  02/13/2018  . Flank pain 02/13/2018  . Acute cystitis with hematuria 10/31/2017  . Fecal impaction of colon ([place]) 03/24/2017  . Left lower quadrant pain 03/24/2017  . Severe protein-calorie malnutrition ([place]) 09/24/2016  . Acute constipation 08/20/2016  . Long term (current) use of antibiotics 07/26/2016  . Breakdown (mechanical) of cystostomy catheter, initial  encounter ([place]) 06/09/2016  . Chronic left-sided low back pain with sciatica 08/08/2015  . Disorder of urethral catheter ([place]) 06/28/2015  . Generalized abdominal pain 04/20/2015  . Non-intractable vomiting with nausea 04/20/2015  . Urinary tract infection associated with catheterization of urinary tract, initial encounter ([place]) 03/20/2015  . Decubitus ulcer of lower back 02/13/2015  . Bowel incontinence 01/16/2015  . Chronic constipation 01/16/2015  . 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 Chronic sciatica 05/19/2014  . Calculus of kidney 08/16/2013  . History of recurrent UTIs 08/16/2013  . Neurogenic bladder 08/16/2013  . Urine retention 05/13/2013  . Scoliosis 07/16/2012   PCP:  Clovia Cuff, MD Pharmacy:   [facility] [id number] [address], [address] AT [facility] OF [address] [place] [address] Phone: [phone] Fax: [phone]     Social Determinants of Health ([place]) Interventions    Readmission Risk Interventions No flowsheet data found.

## 2020-02-04 NOTE — ED Notes (Signed)
Pt.s breakfast arrived. Sat pt up to eat breakfast, pt needed assistance to be fed. Pt did well, ate 95% of her breakfast, ate all except her banana. After pt was done eating, turned her to her left side with the assistance of the RN Beth. Washed pt.s face and helped pt with her oral care.

## 2020-02-04 NOTE — Progress Notes (Signed)
[facility] transport has been set up for patient. [facility] to arrive at [place], LCSW Transitions of [place] ED [phone]

## 2020-02-04 NOTE — ED Notes (Signed)
Pt DC d off unit to group home per provider.  Pt transported by [facility]. Belongings given to [facility] staff. Pt off unit in stretcher, transported by [employer].

## 2020-02-04 NOTE — ED Notes (Signed)
Pt has been turned, given a bed bath and changed into a new gown. Waiting for transport to arrive.

## 2020-02-04 NOTE — Progress Notes (Addendum)
CSW received a call from [place] with [facility] (the pt's group home) who was requesting an update on pt's departure via [facility].  Mark with [facility] is requesting that pt's RN please call Elta Guadeloupe at ph: [phone] once [facility] arrives so preparations will be in place for the pt's arrival to pt's new AFL.  RN updated.  CSW will continue to follow for D/C needs.  Alphonse Guild. Syler Norcia  MSW, LCSW, LCAS, CSI Transitions of Care Clinical Social Worker Care Coordination Department Ph: [phone]

## 2020-02-04 NOTE — ED Provider Notes (Signed)
Emergency Medicine Observation Re-evaluation Note  Disaya Blakley is a 39 y.o. female, seen on rounds today.  Pt initially presented to the ED for complaints of Back Pain and Urinary Tract Infection Currently, the patient is awaiting placement, complained of SI, refused to go back to living facility..  Physical Exam  BP (!) 102/57 (BP Location: Right Arm)   Pulse 60   Temp 97.7 F (36.5 C) (Oral)   Resp 20   Ht 1.702 m (5' 7" )   Wt 72.6 kg   LMP 01/28/2020   SpO2 95%   BMI 25.06 kg/m  Physical Exam deferred ED Course / MDM  EKG:    I have reviewed the labs performed to date as well as medications administered while in observation.  No Recent changes in the last 24 hours.  Urine culture showed multiple specimens.  Blood pressure low overnight.  Normal this am. Plan  Current plan is for placement. Patient is not under full IVC at this time.   Dorie Rank, MD 02/04/20 [phone]

## 2020-02-04 NOTE — ED Notes (Signed)
Pt resting comfortably, no s/s of distress. Pt calm and cooperative. Pt was fed breakfast with no difficulty. Pt turned on left side.

## 2020-02-04 NOTE — ED Notes (Signed)
Pt asked for snacks. Gave pt 2 cheese sticks and milk

## 2020-02-17 ENCOUNTER — Encounter (HOSPITAL_BASED_OUTPATIENT_CLINIC_OR_DEPARTMENT_OTHER): Payer: Medicaid Other | Attending: Internal Medicine | Admitting: Internal Medicine

## 2020-06-26 ENCOUNTER — Other Ambulatory Visit: Payer: Self-pay

## 2020-06-26 ENCOUNTER — Emergency Department (HOSPITAL_BASED_OUTPATIENT_CLINIC_OR_DEPARTMENT_OTHER): Payer: Medicare Other

## 2020-06-26 ENCOUNTER — Emergency Department (HOSPITAL_BASED_OUTPATIENT_CLINIC_OR_DEPARTMENT_OTHER)
Admission: EM | Admit: 2020-06-26 | Discharge: 2020-06-26 | Disposition: A | Discharge status: Home / Self Care | Payer: Medicare Other | Attending: Emergency Medicine | Admitting: Emergency Medicine

## 2020-06-26 ENCOUNTER — Encounter (HOSPITAL_BASED_OUTPATIENT_CLINIC_OR_DEPARTMENT_OTHER): Payer: Self-pay | Admitting: Emergency Medicine

## 2020-06-26 DIAGNOSIS — Y939 Activity, unspecified: Secondary | ICD-10-CM | POA: Insufficient documentation

## 2020-06-26 DIAGNOSIS — I10 Essential (primary) hypertension: Secondary | ICD-10-CM | POA: Diagnosis not present

## 2020-06-26 DIAGNOSIS — S31000A Unspecified open wound of lower back and pelvis without penetration into retroperitoneum, initial encounter: Secondary | ICD-10-CM | POA: Diagnosis not present

## 2020-06-26 DIAGNOSIS — X58XXXA Exposure to other specified factors, initial encounter: Secondary | ICD-10-CM | POA: Diagnosis not present

## 2020-06-26 DIAGNOSIS — N898 Other specified noninflammatory disorders of vagina: Secondary | ICD-10-CM | POA: Diagnosis not present

## 2020-06-26 DIAGNOSIS — Y999 Unspecified external cause status: Secondary | ICD-10-CM | POA: Diagnosis not present

## 2020-06-26 DIAGNOSIS — R1030 Lower abdominal pain, unspecified: Secondary | ICD-10-CM | POA: Diagnosis not present

## 2020-06-26 DIAGNOSIS — S3992XA Unspecified injury of lower back, initial encounter: Secondary | ICD-10-CM | POA: Diagnosis present

## 2020-06-26 DIAGNOSIS — Y929 Unspecified place or not applicable: Secondary | ICD-10-CM | POA: Insufficient documentation

## 2020-06-26 DIAGNOSIS — N390 Urinary tract infection, site not specified: Secondary | ICD-10-CM | POA: Insufficient documentation

## 2020-06-26 HISTORY — DX: Essential (primary) hypertension: I10

## 2020-06-26 HISTORY — DX: Schizoaffective disorder, unspecified: F25.9

## 2020-06-26 LAB — COMPREHENSIVE METABOLIC PANEL
ALT: 11 U/L (ref 0–44)
AST: 13 U/L — ABNORMAL LOW (ref 15–41)
Albumin: 3.2 g/dL — ABNORMAL LOW (ref 3.5–5.0)
Alkaline Phosphatase: 82 U/L (ref 38–126)
Anion gap: 8 (ref 5–15)
BUN: 13 mg/dL (ref 6–20)
CO2: 25 mmol/L (ref 22–32)
Calcium: 8.7 mg/dL — ABNORMAL LOW (ref 8.9–10.3)
Chloride: 103 mmol/L (ref 98–111)
Creatinine, Ser: 0.43 mg/dL — ABNORMAL LOW (ref 0.44–1.00)
GFR calc Af Amer: >60 mL/min (ref >60)
GFR calc non Af Amer: >60 mL/min (ref >60)
Glucose, Bld: 96 mg/dL (ref 70–99)
Potassium: 4.3 mmol/L (ref 3.5–5.1)
Sodium: 136 mmol/L (ref 135–145)
Total Bilirubin: 0.1 mg/dL — ABNORMAL LOW (ref 0.3–1.2)
Total Protein: 8.1 g/dL (ref 6.5–8.1)

## 2020-06-26 LAB — URINALYSIS, ROUTINE W REFLEX MICROSCOPIC
Bilirubin Urine: NEGATIVE
Glucose, UA: NEGATIVE mg/dL
Hgb urine dipstick: NEGATIVE
Ketones, ur: NEGATIVE mg/dL
Nitrite: POSITIVE — AB
Protein, ur: NEGATIVE mg/dL
Specific Gravity, Urine: 1.01 (ref 1.005–1.030)
pH: 7 (ref 5.0–8.0)

## 2020-06-26 LAB — CBC WITH DIFFERENTIAL/PLATELET
Abs Immature Granulocytes: 0.04 10*3/uL (ref 0.00–0.07)
Basophils Absolute: 0.1 10*3/uL (ref 0.0–0.1)
Basophils Relative: 1 %
Eosinophils Absolute: 0.3 10*3/uL (ref 0.0–0.5)
Eosinophils Relative: 3 %
HCT: 36.7 % (ref 36.0–46.0)
Hemoglobin: 11 g/dL — ABNORMAL LOW (ref 12.0–15.0)
Immature Granulocytes: 0 %
Lymphocytes Relative: 15 %
Lymphs Abs: 1.4 10*3/uL (ref 0.7–4.0)
MCH: 25.1 pg — ABNORMAL LOW (ref 26.0–34.0)
MCHC: 30 g/dL (ref 30.0–36.0)
MCV: 83.8 fL (ref 80.0–100.0)
Monocytes Absolute: 0.5 10*3/uL (ref 0.1–1.0)
Monocytes Relative: 5 %
Neutro Abs: 7.1 10*3/uL (ref 1.7–7.7)
Neutrophils Relative %: 76 %
Platelets: 348 10*3/uL (ref 150–400)
RBC: 4.38 MIL/uL (ref 3.87–5.11)
RDW: 18.8 % — ABNORMAL HIGH (ref 11.5–15.5)
WBC: 9.4 10*3/uL (ref 4.0–10.5)
nRBC: 0 % (ref 0.0–0.2)

## 2020-06-26 LAB — WET PREP, GENITAL
Clue Cells Wet Prep HPF POC: NONE SEEN
Sperm: NONE SEEN
Trich, Wet Prep: NONE SEEN
Yeast Wet Prep HPF POC: NONE SEEN

## 2020-06-26 LAB — URINALYSIS, MICROSCOPIC (REFLEX)

## 2020-06-26 MED ORDER — IOHEXOL 300 MG/ML  SOLN
100.0000 mL | Freq: Once | INTRAMUSCULAR | Status: AC | PRN
  Administered 2020-06-26: 100 mL via INTRAVENOUS

## 2020-06-26 MED ORDER — CEPHALEXIN 500 MG PO CAPS: 500.0000 mg | ORAL_CAPSULE | Freq: Three times a day (TID) | ORAL | 0 refills | Status: AC

## 2020-06-26 NOTE — ED Provider Notes (Signed)
[facility]PARTMENT Provider Note   CSN: [id number] Arrival date & time: 06/26/20  1004     History Chief Complaint  Patient presents with  . Vaginal Bleeding  . Vaginal Discharge    Pamela Fuller is a 39 y.o. female.  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  Patient reports that she had been seen in an emergency department for similar symptoms, did not have a pelvic exam, was told that she may have a urinary tract infection.  Presents with concern for vaginal discharge, burning for 2-3 weeks Started on antibiotics in ED for UTI which completed this weekend but no change in symptoms Yellow-brown discharge Not currently sexually active, no recent exposures or concerns for STI, had previous testing after she was sexually active  Lower abdominal pain for 2-3 weeks No vomiting, slight nausea No fevers No change in ostomy output  Sacral wound [facility], [facility] follows, wound vac in place. No concern regarding changes, said it was healing and looking good on Fridayk, see it three times a week. No increasing pain.  Past Medical History:  Diagnosis Date  . Cerebral palsy ([place])   . Hypertension   . Schizoaffective disorder [facility])     Patient Active Problem List   Diagnosis Date Noted  . Delusions ([place])   . Acute pain of right knee 11/11/2019  . Closed fracture of distal end of femur with routine healing, subsequent encounter 11/11/2019  . Iron deficiency anemia 09/01/2019  . Benign essential hypertension 08/31/2019  . Cerebral palsy ([place]) 08/31/2019  . Chronic suprapubic catheter ([place]) 08/31/2019  . Functional quadriplegia ([place]) 08/31/2019  . PTSD (post-traumatic stress disorder) 08/31/2019  . Sacral decubitus ulcer, stage IV ([place]) 08/31/2019   . Schizoaffective disorder ([place]) 08/31/2019  . Sepsis due to urinary tract infection ([place]) 07/18/2018  . Pain management contract signed 07/17/2018  . Celiac disease 07/03/2018  . IBS (irritable bowel syndrome) 07/03/2018  . Bladder stones 02/13/2018  . Flank pain 02/13/2018  . Acute cystitis with hematuria 10/31/2017  . Fecal impaction of colon ([place]) 03/24/2017  . Left lower quadrant pain 03/24/2017  . Severe protein-calorie malnutrition ([place]) 09/24/2016  . Acute constipation 08/20/2016  . Long term (current) use of antibiotics 07/26/2016  . Breakdown (mechanical) of cystostomy catheter, initial encounter ([place]) 06/09/2016  . Chronic left-sided low back pain with sciatica 08/08/2015  . Disorder of urethral catheter ([place]) 06/28/2015  . Generalized abdominal pain 04/20/2015  . Non-intractable vomiting with nausea 04/20/2015  . Urinary tract infection associated with catheterization of urinary tract, initial encounter ([place]) 03/20/2015  . Decubitus ulcer of lower back 02/13/2015  . Bowel incontinence 01/16/2015  . Chronic constipation 01/16/2015  . 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 Chronic sciatica 05/19/2014  . Calculus of kidney 08/16/2013  . History of recurrent UTIs 08/16/2013  . Neurogenic bladder 08/16/2013  . Urine retention 05/13/2013  . Scoliosis 07/16/2012    No past surgical history on file.   OB History   No obstetric history on file.     No family history on file.  Social History   Tobacco Use  . Smoking status: Never Smoker  . Smokeless tobacco: Never Used  Vaping Use  . Vaping Use: Never used  Substance Use Topics  . Alcohol use: Never  . Drug use:  Never    Home Medications Prior to Admission medications   Medication Sig Start Date End Date Taking? Authorizing Provider  baclofen (LIORESAL) 10 MG tablet Take 1.5 tablets (15 mg total) by mouth 3 (three) times daily. 10/03/19   Robinson, [place] N, PA-C  cephALEXin (KEFLEX) 500 MG capsule  Take 1 capsule (500 mg total) by mouth 3 (three) times daily for 7 days. 06/26/20 07/03/20  Gareth Morgan, MD  dorzolamide-timolol (COSOPT) 22.3-6.8 MG/ML ophthalmic solution Place 1 drop into both eyes 2 (two) times daily.    [provider]  Nyoka Cowden Tea 250 MG CAPS Take 250 mg by mouth in the morning and at bedtime.    [provider]  Javier Docker Oil 1000 MG CAPS Take 1 capsule by mouth daily.     [provider]  latanoprost (XALATAN) 0.005 % ophthalmic solution Place 1 drop into both eyes at bedtime.    [provider]  Melatonin 10 MG TABS Take 10 mg by mouth at bedtime.    [provider]  risperiDONE (RISPERDAL) 0.25 MG tablet Take 0.5 mg by mouth 2 (two) times daily.    [provider]  sertraline (ZOLOFT) 50 MG tablet Take 50 mg by mouth daily.    [provider]    Allergies    Levaquin [levofloxacin], Codeine, Docusate, Quinolones, and Sulfa antibiotics  Review of Systems   Review of Systems  Constitutional: Negative for fever.  HENT: Negative for sore throat.   Eyes: Negative for visual disturbance.  Respiratory: Negative for cough and shortness of breath.   Cardiovascular: Negative for chest pain.  Gastrointestinal: Positive for abdominal pain and nausea. Negative for diarrhea (no increased ostomy) and vomiting.  Genitourinary: Positive for vaginal bleeding and vaginal discharge. Negative for difficulty urinating.  Musculoskeletal: Negative for back pain and neck pain.  Skin: Negative for rash.  Neurological: Negative for syncope and headaches.    Physical Exam Updated Vital Signs BP 97/68 (BP Location: Right Arm)   Pulse 65   Temp 98.2 F (36.8 C) (Oral)   Resp 16   SpO2 98%   Physical Exam Vitals and nursing note reviewed.  Constitutional:      General: She is not in acute distress.    Appearance: Normal appearance. She is not ill-appearing, toxic-appearing or diaphoretic.  HENT:     Head: Normocephalic.   Eyes:     Conjunctiva/sclera: Conjunctivae normal.  Cardiovascular:     Rate and Rhythm: Normal rate and regular rhythm.     Pulses: Normal pulses.  Pulmonary:     Effort: Pulmonary effort is normal. No respiratory distress.  Abdominal:     Tenderness: There is abdominal tenderness (suprapubic).     Comments: Suprapubic catheter in place   Genitourinary:    Cervix: Discharge present. No cervical motion tenderness.     Uterus: Tender.      Adnexa:        Right: No tenderness.         Left: No tenderness.    Musculoskeletal:        General: No deformity or signs of injury.     Cervical back: No rigidity.  Skin:    General: Skin is warm and dry.     Coloration: Skin is not jaundiced or pale.     Comments: Wound vac over sacral wound, noted red-purulent drainage coming from wound vac, no fluctuance palpated, mild erythema   Neurological:     General: No focal deficit present.  Mental Status: She is alert and oriented to person, place, and time.     ED Results / Procedures / Treatments   Labs (all labs ordered are listed, but only abnormal results are displayed) Labs Reviewed  WET PREP, GENITAL - Abnormal; Notable for the following components:      Result Value   WBC, Wet Prep HPF POC FEW (*)    All other components within normal limits  CBC WITH DIFFERENTIAL/PLATELET - Abnormal; Notable for the following components:   Hemoglobin 11.0 (*)    MCH 25.1 (*)    RDW 18.8 (*)    All other components within normal limits  COMPREHENSIVE METABOLIC PANEL - Abnormal; Notable for the following components:   Creatinine, Ser 0.43 (*)    Calcium 8.7 (*)    Albumin 3.2 (*)    AST 13 (*)    Total Bilirubin 0.1 (*)    All other components within normal limits  URINALYSIS, ROUTINE W REFLEX MICROSCOPIC - Abnormal; Notable for the following components:   Nitrite POSITIVE (*)    Leukocytes,Ua LARGE (*)    All other components within normal limits  URINALYSIS, MICROSCOPIC (REFLEX) -  Abnormal; Notable for the following components:   Bacteria, UA FEW (*)    All other components within normal limits  URINE CULTURE  GC/CHLAMYDIA PROBE AMP ([place]) NOT AT [facility]    EKG None  Radiology CT ABDOMEN PELVIS W CONTRAST  Result Date: 06/26/2020 CLINICAL DATA:  Right lower quadrant pain, appendicitis suspected. History of sacral wound. Evaluate for abscess. EXAM: CT ABDOMEN AND PELVIS WITH CONTRAST TECHNIQUE: Multidetector CT imaging of the abdomen and pelvis was performed using the standard protocol following bolus administration of intravenous contrast. CONTRAST:  151m OMNIPAQUE IOHEXOL 300 MG/ML  SOLN COMPARISON:  Multiple priors, most recent CT abdomen and pelvis 06/16/2020 FINDINGS: Lower chest: No consolidation. Minimal ground-glass opacification, favored atelectasis. Hepatobiliary: Evaluation is limited by streak artifact from the patient's spinal fusion hardware; however, no for focal liver abnormality is identified. Gallbladder is decompressed without radiopaque cholelithiasis or pericholecystic inflammatory change. Mild common bile duct dilation is similar to prior. Pancreas: Unremarkable. No pancreatic ductal dilatation or surrounding inflammatory changes. Spleen: Normal in size without focal abnormality. Adrenals/Urinary Tract: Adrenal glands are unremarkable. Symmetric renal enhancement without hydronephrosis. Poor visualization of the ureters secondary to streak artifact. Bladder calculi. The bladder is decompressed around a suprapubic catheter, limiting evaluation of the wall. A few locules of gas within the bladder, likely from the catheter. Stomach/Bowel: The stomach is unremarkable. Similar appearance of large stool burden without evidence of obstruction. Diverting ostomy in the mid abdomen. The visualized portions of the appendix is unremarkable. No right lower quadrant inflammatory changes. No evidence of intra-abdominal fluid collection. Similar appearance of a small (8  mm) area of fluid within the right anus. Vascular/Lymphatic: No significant vascular findings are present. No enlarged abdominal or pelvic lymph nodes. Reproductive: Fibroid uterus, similar to priors. Other: No free fluid. Musculoskeletal: Chronic thoracolumbar scoliosis with posterior fixation. Extensive similar appearance of discontinuity of the skin overlying streak artifact limits evaluation of the spine and adjacent structures. Similar appearance of discontinuity of the skin overlying the sacrum with adjacent soft tissue thickening, compatible with decubitus ulcer. Soft tissue thickening extends to the level of the sacrum with sclerosis of the left sacrum, unchanged. No drainable collection. IMPRESSION: 1. No evidence of new/acute abnormality to explain the patient's pain. The partially visualized appendix is normal without right lower quadrant inflammatory changes. No drainable intra-abdominal  fluid collection identified. 2. Similar appearance of a sacral decubitus ulcer and adjacent sclerosis of the sacrum. No focal drainable fluid collection. 3. Similar small amount of fluid within the right anus. 4. Decompressed bladder with suprapubic catheter in place. Electronically Signed   By: Margaretha Sheffield MD   On: 06/26/2020 13:28    Procedures Procedures (including critical care time)  Medications Ordered in ED Medications  iohexol (OMNIPAQUE) 300 MG/ML solution 100 mL (100 mLs Intravenous Contrast Given 06/26/20 1257)    ED Course  I have reviewed the triage vital signs and the nursing notes.  Pertinent labs & imaging results that were available during my care of the patient were reviewed by me and considered in my medical decision making (see chart for details).    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  She also reports lower abdominal pain.  CT abdomen pelvis done to evaluate for signs of abscess in the setting of chronic sacral wound, abdominal pain, and concern for vaginal discharge.  CT shows no evidence of acute abnormalities, shows chronic sacral wound which is unchanged and no evidence of abscess or fluid collection.  On pelvic exam, I see no evidence of fistula and CT does not show other acute abnormalities. I do not see signs of anal abscess.  She has not been sexually active recently, with low suspicion for STI/PID. Doubt TOA given CT and exam. Wet prep negative.  While she does have some vaginal discharge on exam it is possible that the drainage that she is concerned about is coming from her sacral wound.  The wound vac does not appear to be functioning normally> she does not have signs of cellulitis, but does have foul smelling drainage--- and given chronic wound care has been following and saw most recently Friday I feel it is appropriate to continue to have their evaluation for continued management of her chronic wound and infection.  Her blood pressures appear at baseline. No signs of sepsis.   Given lower abdominal pain and urinalysis findings, will treat for UTI with keflex.  Recommend close follow up with PCP and wound care.    Final Clinical Impression(s) / ED Diagnoses Final diagnoses:  Wound of sacral region, initial encounter  Vaginal discharge  Lower abdominal pain  Urinary tract infection without hematuria, site unspecified    Rx / DC Orders ED Discharge Orders         Ordered    cephALEXin (KEFLEX) 500 MG capsule  3 times daily        06/26/20 1505           Gareth Morgan, MD 06/26/20 1623

## 2020-06-26 NOTE — ED Notes (Signed)
Patient transported to CT 

## 2020-06-26 NOTE — ED Notes (Signed)
ED Provider at bedside. 

## 2020-06-26 NOTE — ED Notes (Addendum)
Pt appears very dirty. She is wearing an old stained hospital gown. Her hair is matted and full of flakes and her skin is crusty and dry. She states she lives with a caregiver who is not a family member and has home health who manages the wound vac. Her vagina and thighs are covered in dried blood.

## 2020-06-26 NOTE — ED Notes (Signed)
Pt states she is on her menstrual. She states she is concerned about a smelly vaginal discharge that she has had for awhile. She recently finished abx. Pt has a wound vac in place to sacrum. On assessment, foul smelling bloody/brown drainage is noted from around the dressing to the wound vac. Pt also has a surapubic catheter and colostomy in place.

## 2020-06-26 NOTE — ED Triage Notes (Addendum)
Per EMS: Pt has been having vaginal bleeding x 3 weeks.  Pt attempted to get an MD appointment but has not heard back from MD office.  Pt comes in today for evaluation.  NAD noted.

## 2020-06-27 LAB — GC/CHLAMYDIA PROBE AMP (~~LOC~~) NOT AT ARMC
Chlamydia: NEGATIVE
Comment: NEGATIVE
Comment: NORMAL
Neisseria Gonorrhea: NEGATIVE

## 2020-06-28 LAB — URINE CULTURE: Culture: >=100000 — AB

## 2020-06-29 ENCOUNTER — Telehealth: Payer: Self-pay

## 2020-06-29 NOTE — Telephone Encounter (Signed)
Post ED Visit - Positive Culture Follow-up: Unsuccessful Patient Follow-up  Culture assessed and recommendations reviewed by:  []  Elenor Quinones, Pharm.D. []  Heide Guile, Pharm.D., BCPS AQ-ID []  Parks Neptune, Pharm.D., BCPS []  Alycia Rossetti, Pharm.D., BCPS []  [place], Pharm.D., BCPS, AAHIVP []  Legrand Como, Pharm.D., BCPS, AAHIVP []  Wynell Balloon, PharmD []  Vincenza Hews, PharmD, BCPS  Alfonse Spruce Pharm D Positive urine culture  []  Patient discharged without antimicrobial prescription and treatment is now indicated [x]  Organism is resistant to prescribed ED discharge antimicrobial []  Patient with positive blood cultures  Needs Fosfomycin 3 gm Daily x 3 doses per Dr Langston Masker  Unable to contact patient after 3 attempts, letter will be sent to address on file  Genia Del 06/29/2020, 9:26 AM

## 2020-08-31 ENCOUNTER — Other Ambulatory Visit: Payer: Self-pay

## 2020-08-31 ENCOUNTER — Emergency Department (HOSPITAL_COMMUNITY)
Admission: EM | Admit: 2020-08-31 | Discharge: 2020-08-31 | Disposition: A | Discharge status: Home / Self Care | Payer: Medicare Other | Attending: Emergency Medicine | Admitting: Emergency Medicine

## 2020-08-31 ENCOUNTER — Encounter (HOSPITAL_COMMUNITY): Payer: Self-pay | Admitting: Student

## 2020-08-31 DIAGNOSIS — I10 Essential (primary) hypertension: Secondary | ICD-10-CM | POA: Insufficient documentation

## 2020-08-31 DIAGNOSIS — T83091A Other mechanical complication of indwelling urethral catheter, initial encounter: Secondary | ICD-10-CM | POA: Diagnosis not present

## 2020-08-31 DIAGNOSIS — Z20822 Contact with and (suspected) exposure to covid-19: Secondary | ICD-10-CM | POA: Diagnosis not present

## 2020-08-31 DIAGNOSIS — T839XXA Unspecified complication of genitourinary prosthetic device, implant and graft, initial encounter: Secondary | ICD-10-CM

## 2020-08-31 LAB — URINALYSIS, ROUTINE W REFLEX MICROSCOPIC
Bilirubin Urine: NEGATIVE
Glucose, UA: NEGATIVE mg/dL
Hgb urine dipstick: NEGATIVE
Ketones, ur: NEGATIVE mg/dL
Nitrite: POSITIVE — AB
Protein, ur: NEGATIVE mg/dL
Specific Gravity, Urine: 1.008 (ref 1.005–1.030)
WBC, UA: >50 WBC/hpf — ABNORMAL HIGH (ref 0–5)
pH: 7 (ref 5.0–8.0)

## 2020-08-31 LAB — RESPIRATORY PANEL BY RT PCR (FLU A&B, COVID)
Influenza A by PCR: NEGATIVE
Influenza B by PCR: NEGATIVE
SARS Coronavirus 2 by RT PCR: NEGATIVE

## 2020-08-31 MED ORDER — CEPHALEXIN 500 MG PO CAPS: 500.0000 mg | ORAL_CAPSULE | Freq: Three times a day (TID) | ORAL | 0 refills | Status: AC

## 2020-08-31 NOTE — ED Notes (Signed)
Guardian Heather called back and said that facility cannot come pick patient up after all and patient would need to go by [facility].  [facility] called for transport.

## 2020-08-31 NOTE — ED Triage Notes (Signed)
Patient BIB [facility] c/o urinary catheter problems.  Patient is from home with a caretaker.  Caretaker attempted to flush catheter with no output.  Some discharge is present around catheter and there is some foul odor per EMS.  Patient also has a sore throat.  Patient has a hx of cerebral palsy.  Vitals were 112/72 24-RR 66-HR 98% RA 97.3 temp

## 2020-08-31 NOTE — Discharge Instructions (Addendum)
You were evaluated in the Emergency Department and after careful evaluation, we did not find any emergent condition requiring admission or further testing in the hospital.  Your exam/testing today was overall reassuring.  We replaced your catheter here in the emergency department.  We recommend follow-up with a urologist to discuss your frequent need for catheter replacement.  Please return to the Emergency Department if you experience any worsening of your condition.  Thank you for allowing [place] to be a part of your care.

## 2020-08-31 NOTE — ED Provider Notes (Signed)
[facility] Emergency Department Provider Note MRN:  [id number]  Arrival date & time: 08/31/20     Chief Complaint   Catheter issue History of Present Illness   Pamela Fuller is a 39 y.o. year-old female with a history of cerebral palsy presenting to the ED with chief complaint of catheter issue.  Foley catheter is not draining since this morning.  Denies any pain or any complaints otherwise.  She is frustrated that this seems to happen every week.  No fever, no cough.  Does endorse foul-smelling odor of urine  Review of Systems  A problem-focused ROS was performed. Positive for Foley catheter issue, foul smelling urine.  Patient denies fever.  Patient's Health History    Past Medical History:  Diagnosis Date  . Cerebral palsy ([place])   . Hypertension   . Schizoaffective disorder ([place])     History reviewed. No pertinent surgical history.  History reviewed. No pertinent family history.  Social History   Socioeconomic History  . Marital status: Single    Spouse name: Not on file  . Number of children: Not on file  . Years of education: Not on file  . Highest education level: Not on file  Occupational History  . Not on file  Tobacco Use  . Smoking status: Never Smoker  . Smokeless tobacco: Never Used  Vaping Use  . Vaping Use: Never used  Substance and Sexual Activity  . Alcohol use: Never  . Drug use: Never  . Sexual activity: Not on file  Other Topics Concern  . Not on file  Social History Narrative  . Not on file   Social Determinants of Health   Financial Resource Strain:   . Difficulty of Paying Living Expenses: Not on file  Food Insecurity:   . Worried About Charity fundraiser in the Last Year: Not on file  . Ran Out of Food in the Last Year: Not on file  Transportation Needs:   . Lack of Transportation (Medical): Not on file  . Lack of Transportation (Non-Medical): Not on file  Physical Activity:   . Days of Exercise per  Week: Not on file  . Minutes of Exercise per Session: Not on file  Stress:   . Feeling of Stress : Not on file  Social Connections:   . Frequency of Communication with Friends and Family: Not on file  . Frequency of Social Gatherings with Friends and Family: Not on file  . Attends Religious Services: Not on file  . Active Member of Clubs or Organizations: Not on file  . Attends Archivist Meetings: Not on file  . Marital Status: Not on file  Intimate Partner Violence:   . Fear of Current or Ex-Partner: Not on file  . Emotionally Abused: Not on file  . Physically Abused: Not on file  . Sexually Abused: Not on file     Physical Exam   Vitals:   08/31/20 1530 08/31/20 1545  BP: 108/69 104/74  Pulse:  82  Resp: 15 16  Temp:    SpO2: 93% 95%    CONSTITUTIONAL: Chronically ill-appearing, NAD NEURO:  Alert and oriented x 3, chronic spastic changes EYES:  eyes equal and reactive ENT/NECK:  no LAD, no JVD CARDIO: Regular rate, well-perfused, normal S1 and S2 PULM:  CTAB no wheezing or rhonchi GI/GU:  normal bowel sounds, non-distended, non-tender MSK/SPINE:  No gross deformities, no edema SKIN:  no rash, atraumatic PSYCH:  Appropriate speech and behavior  *Additional  and/or pertinent findings included in MDM below  Diagnostic and Interventional Summary    EKG Interpretation  Date/Time:    Ventricular Rate:    PR Interval:    QRS Duration:   QT Interval:    QTC Calculation:   R Axis:     Text Interpretation:        Labs Reviewed  URINALYSIS, ROUTINE W REFLEX MICROSCOPIC - Abnormal; Notable for the following components:      Result Value   APPearance HAZY (*)    Nitrite POSITIVE (*)    Leukocytes,Ua LARGE (*)    WBC, UA >50 (*)    Bacteria, UA FEW (*)    All other components within normal limits  RESPIRATORY PANEL BY RT PCR (FLU A&B, COVID)    No orders to display    Medications - No data to display   Procedures  /  Critical Care Procedures  ED  Course and Medical Decision Making  I have reviewed the triage vital signs, the nursing notes, and pertinent available records from the EMR.  Listed above are laboratory and imaging tests that I personally ordered, reviewed, and interpreted and then considered in my medical decision making (see below for details).  We will replace Foley and ensure good flow of urine, benign abdomen, normal vital signs, suspect sediment in the catheter has caused a blockage.     Draining urine well after catheter replacement.  Patient endorsing sore throat for the past few hours, no signs of significant infection to the back of the throat, largely no erythema, nothing to suggest strep throat.  Patient is requesting swab for coronavirus.  Urinalysis with evidence of infection, will treat with Keflex.  Appropriate for discharge.  Barth Kirks. Sedonia Small, [place] [email or web address]  Final Clinical Impressions(s) / ED Diagnoses     ICD-10-CM   1. Problem with Foley catheter, initial encounter ([place])  [email or web address]     ED Discharge Orders         Ordered    cephALEXin (KEFLEX) 500 MG capsule  3 times daily        08/31/20 1605           Discharge Instructions Discussed with and Provided to Patient:     Discharge Instructions     You were evaluated in the Emergency Department and after careful evaluation, we did not find any emergent condition requiring admission or further testing in the hospital.  Your exam/testing today was overall reassuring.  We replaced your catheter here in the emergency department.  We recommend follow-up with a urologist to discuss your frequent need for catheter replacement.  Please return to the Emergency Department if you experience any worsening of your condition.  Thank you for allowing [place] to be a part of your care.        Maudie Flakes, MD 08/31/20 1606

## 2020-08-31 NOTE — ED Notes (Signed)
Spoke with patient's legal guardian Wayna Chalet. Awaiting call back from her to see if the facility can pick patient up.

## 2020-09-01 NOTE — ED Notes (Signed)
Discharged with no concerns

## 2020-09-02 LAB — URINE CULTURE

## 2020-09-29 IMAGING — CT CT ABD-PELV W/ CM
2 of 5 series · 17 of 46 positions shown, 19 images · IV contrast (Omnipaque)
Comparison: 11/24/2019

CLINICAL DATA: Abdominal pain

EXAM:
CT ABDOMEN AND PELVIS WITH CONTRAST
TECHNIQUE: Multidetector CT imaging of the abdomen and pelvis was performed
using the standard protocol following bolus administration of
intravenous contrast.
CONTRAST:  100mL OMNIPAQUE IOHEXOL 300 MG/ML  SOLN

[Series 2: axial (person_name) (person_name) · axial · 0.90mm/px · z∈[-360,+40]mm · 14 of 90 slices shown, 16 images]
[im 5/90  soft-tissue]
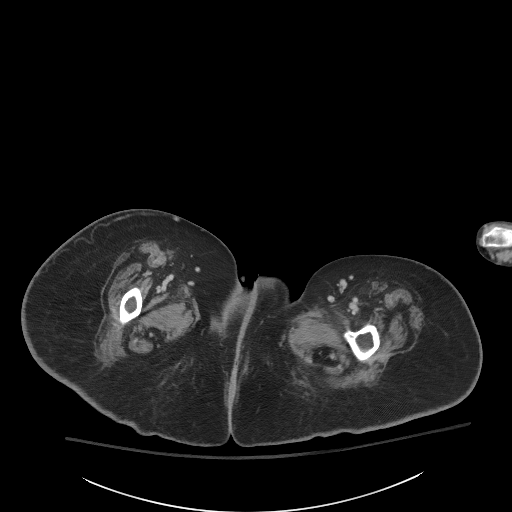
[im 5/90  bone]
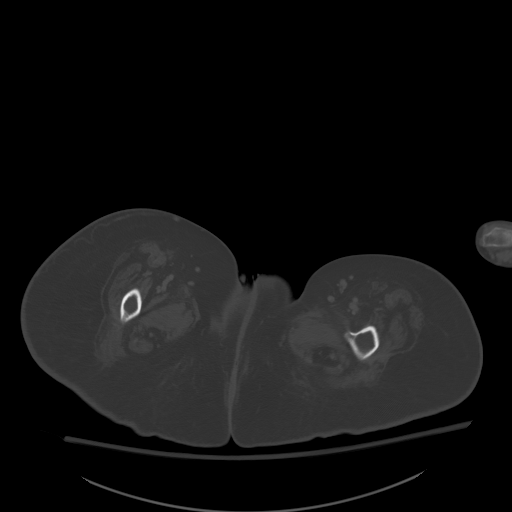
[im 10/90  soft-tissue]
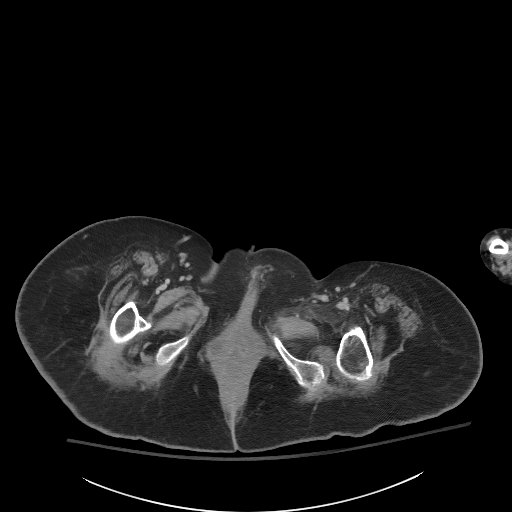
[im 19/90  soft-tissue]
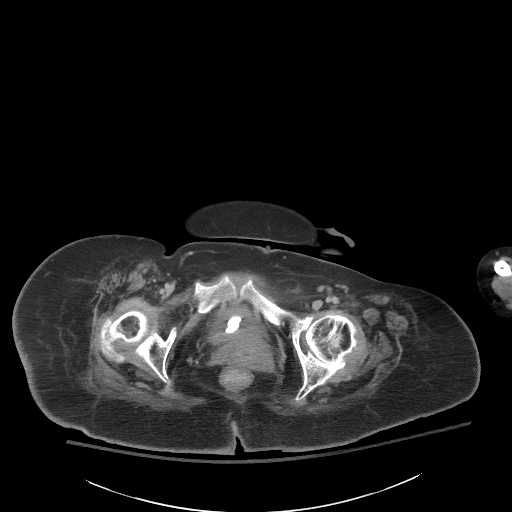
[im 24/90  soft-tissue]
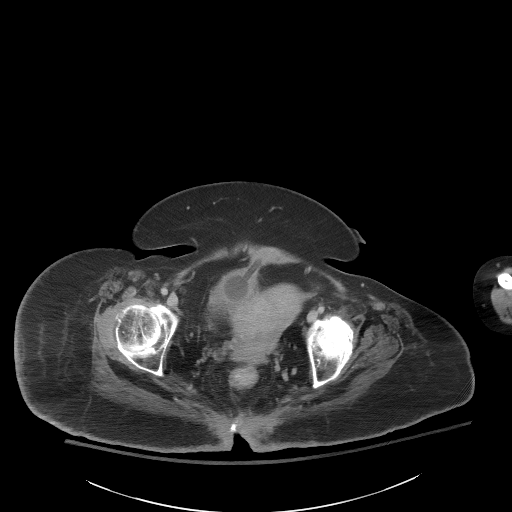
[im 29/90  soft-tissue]
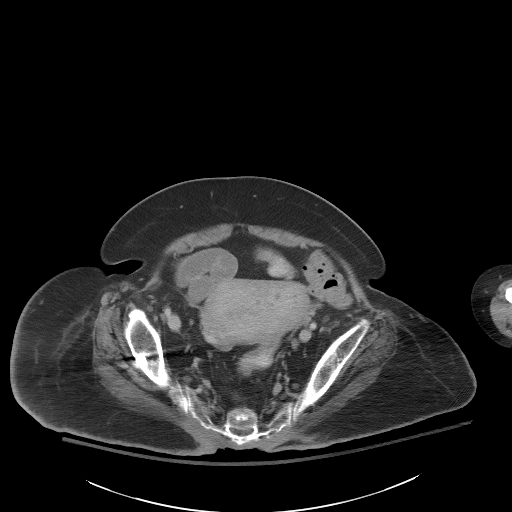
[im 38/90  soft-tissue]
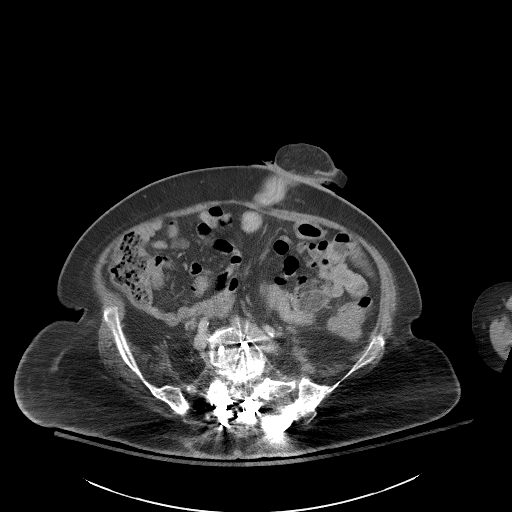
[im 43/90  soft-tissue]
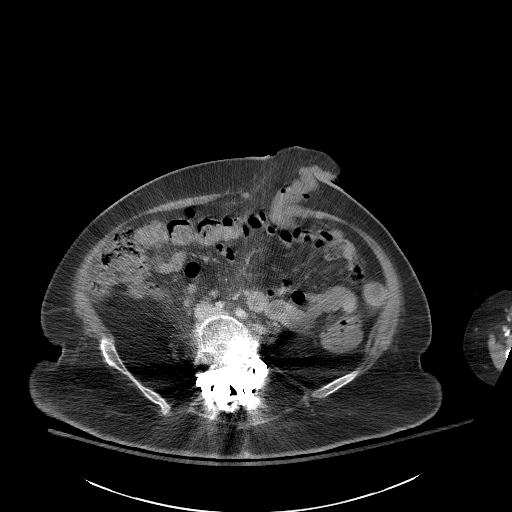
[im 47/90  soft-tissue]
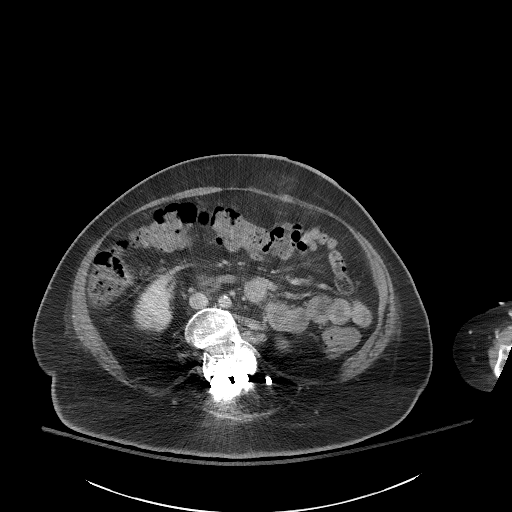
[im 52/90  soft-tissue]
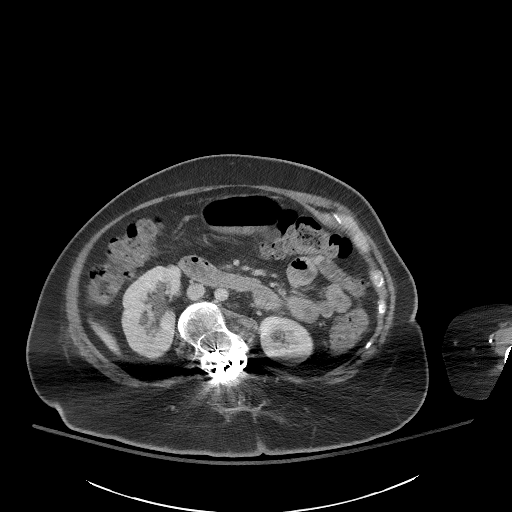
[im 52/90  bone]
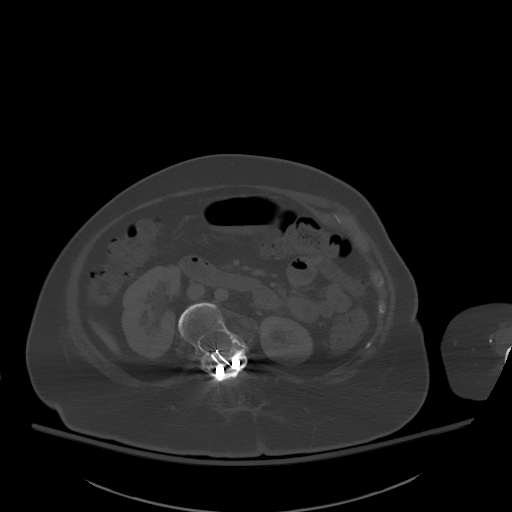
[im 61/90  soft-tissue]
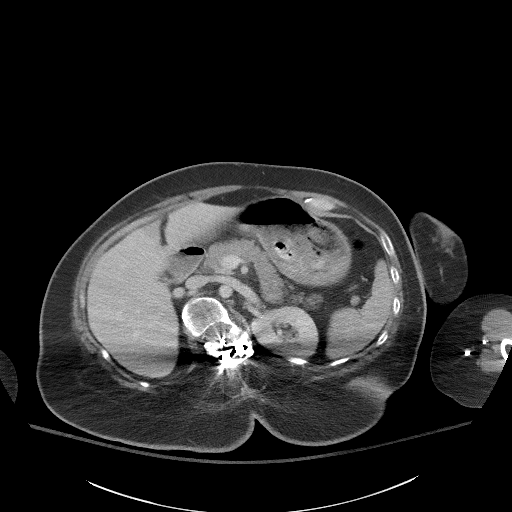
[im 66/90  soft-tissue]
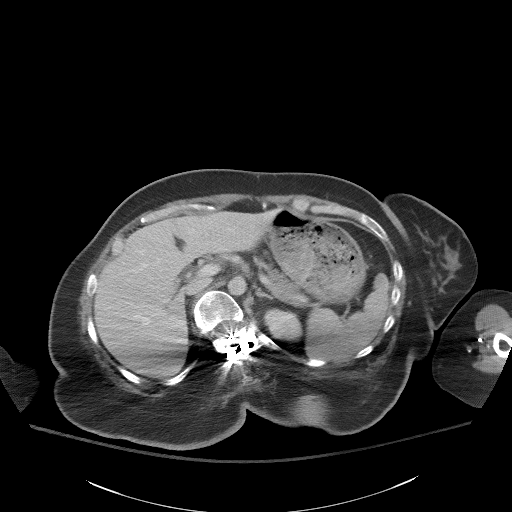
[im 71/90  soft-tissue]
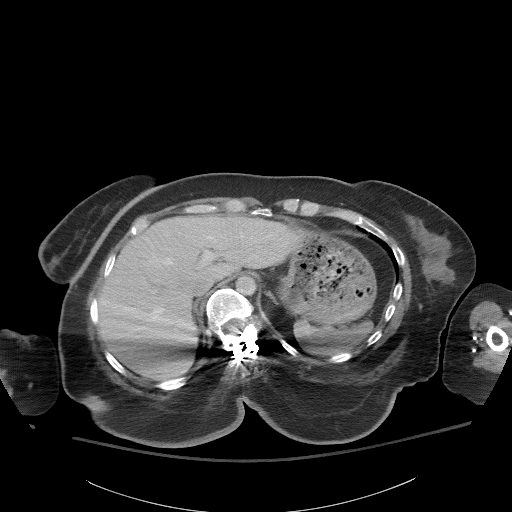
[im 80/90  soft-tissue]
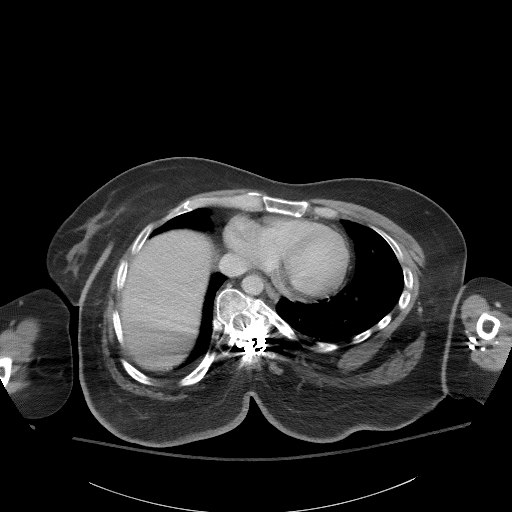
[im 85/90  soft-tissue]
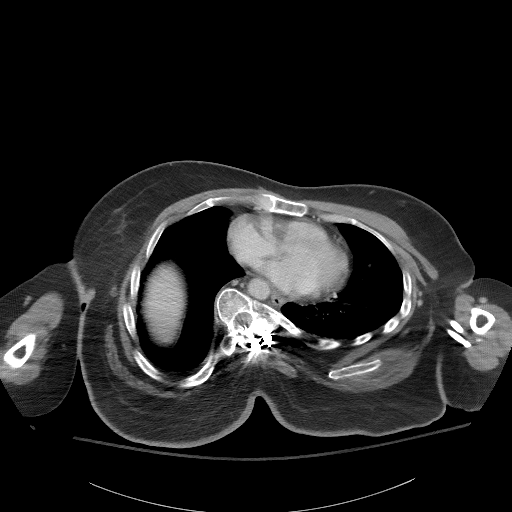

[Series 5: coronal st · coronal · 0.93mm/px · 3 of 94 slices shown]
[im 32/94  soft-tissue]
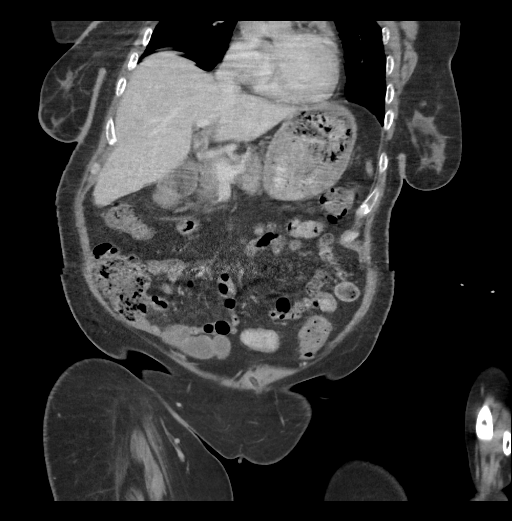
[im 42/94  soft-tissue]
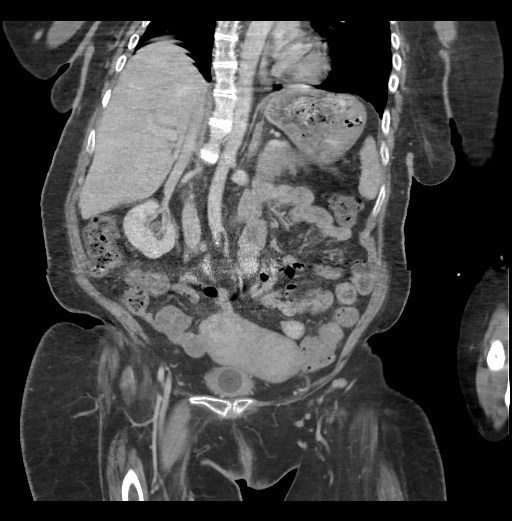
[im 52/94  soft-tissue]
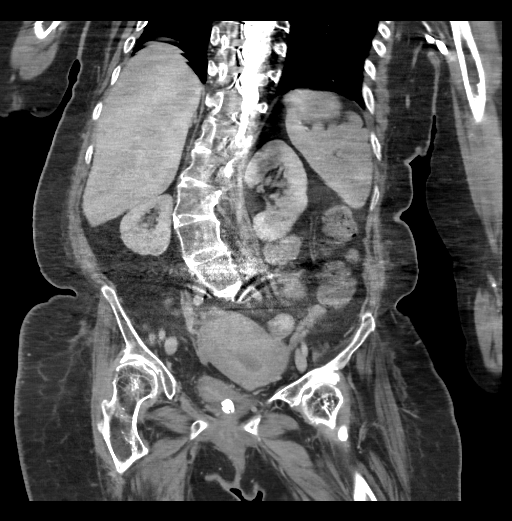

[17 of 46 positions shown; findings below may reference images not displayed]

FINDINGS: Lower chest: Thoracic deformity with severe scoliosis. No acute
abnormality.

Hepatobiliary: No focal hepatic abnormality. Gallbladder
unremarkable.

Pancreas: No focal abnormality or ductal dilatation.

Spleen: No focal abnormality.  Normal size.

Adrenals/Urinary Tract: No renal or adrenal mass. No hydronephrosis.
Urinary bladder decompressed with suprapubic catheter in place.

Stomach/Bowel: Left lower quadrant ostomy noted. Moderate stool in
the colon. No evidence of bowel obstruction. Stomach and small bowel
grossly unremarkable.

Vascular/Lymphatic: No evidence of aneurysm or adenopathy.

Reproductive: Fibroid uterus.  No adnexal mass.

Other: No free fluid or free air.

Musculoskeletal: Severe thoracolumbar scoliosis. Sacral decubitus
ulcer again noted. Chronic appearing changes noted in the underlying
sacrum. Bilateral posterior spinal rods noted.
IMPRESSION: No acute findings in the abdomen or pelvis.

Moderate stool in the colon.  Left lower quadrant ostomy, unchanged.

Sacral decubitus ulcer noted with chronic changes in the sacrum.

## 2020-12-26 ENCOUNTER — Encounter (HOSPITAL_BASED_OUTPATIENT_CLINIC_OR_DEPARTMENT_OTHER): Payer: Medicare Other | Admitting: Internal Medicine

## 2021-01-16 ENCOUNTER — Other Ambulatory Visit: Payer: Self-pay | Admitting: Urology

## 2021-01-29 ENCOUNTER — Other Ambulatory Visit: Payer: Self-pay

## 2021-01-29 ENCOUNTER — Encounter (HOSPITAL_BASED_OUTPATIENT_CLINIC_OR_DEPARTMENT_OTHER): Payer: Medicare Other | Attending: Internal Medicine | Admitting: Internal Medicine

## 2021-01-29 DIAGNOSIS — L89152 Pressure ulcer of sacral region, stage 2: Secondary | ICD-10-CM | POA: Insufficient documentation

## 2021-01-29 DIAGNOSIS — Z885 Allergy status to narcotic agent status: Secondary | ICD-10-CM | POA: Insufficient documentation

## 2021-01-29 DIAGNOSIS — L89312 Pressure ulcer of right buttock, stage 2: Secondary | ICD-10-CM | POA: Insufficient documentation

## 2021-01-29 DIAGNOSIS — Z882 Allergy status to sulfonamides status: Secondary | ICD-10-CM | POA: Insufficient documentation

## 2021-01-29 DIAGNOSIS — I10 Essential (primary) hypertension: Secondary | ICD-10-CM | POA: Insufficient documentation

## 2021-01-29 DIAGNOSIS — Z881 Allergy status to other antibiotic agents status: Secondary | ICD-10-CM | POA: Diagnosis not present

## 2021-01-29 DIAGNOSIS — Z91011 Allergy to milk products: Secondary | ICD-10-CM | POA: Diagnosis not present

## 2021-01-29 DIAGNOSIS — Z993 Dependence on wheelchair: Secondary | ICD-10-CM | POA: Diagnosis not present

## 2021-01-29 DIAGNOSIS — Z888 Allergy status to other drugs, medicaments and biological substances status: Secondary | ICD-10-CM | POA: Insufficient documentation

## 2021-01-29 DIAGNOSIS — G800 Spastic quadriplegic cerebral palsy: Secondary | ICD-10-CM | POA: Insufficient documentation

## 2021-01-29 NOTE — Progress Notes (Addendum)
COVID Vaccine Completed: Yes Date COVID Vaccine completed: x2 Has received booster: yes COVID vaccine manufacturer: Pf[employer]  ??   Date of COVID positive in last 90 days: No  PCP - Clovia Cuff, MD Cardiologist - N/A  Chest x-ray - N/A EKG - greater than 1 year in epic Stress Test - N/A ECHO - N/A Cardiac Cath - N/A Pacemaker/ICD device last checked:N/A  Sleep Study - N/A CPAP - N/A  Fasting Blood Sugar - N/A Checks Blood Sugar __N/A___ times a day  Blood Thinner Instructions:N/A Aspirin Instructions:N/A Last Dose:N/A  Activity level:  Wheelchair bound    Anesthesia review: Cerebral palsy, HTN  Patient denies shortness of breath, fever, cough and chest pain at PAT appointment   Patient verbalized understanding of instructions that were given to them at the PAT appointment. Patient was also instructed that they will need to review over the PAT instructions again at home before surgery.

## 2021-01-29 NOTE — Progress Notes (Signed)
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 Material removed includes Subcutaneous Tissue and Slough and. No specimens were taken. A time out was conducted at 14:30, prior to the start of the procedure. A Moderate amount of bleeding was controlled with Pressure. The procedure was tolerated well with a pain level of 0 throughout and a pain level of 0 following the procedure. Post Debridement Measurements: 3cm length x 2.2cm width x 0.1cm depth; 0.518cm^3 volume. Post debridement Stage noted as Category/Stage II. Character of Wound/Ulcer Post Debridement is improved. Post procedure Diagnosis Wound #4: Same as Pre-Procedure Plan Follow-up Appointments: Return Appointment in 1 week. - ***[facility] -  ROOM 5*** Bathing/ Shower/ Hygiene: May shower and wash wound with soap and water. Off-Loading: Low air-loss mattress (Group 2) Roho cushion for wheelchair Additional Orders / Instructions: Other: - Fax copy of orders to T[facility] at [facility] ([facility] [phone] Home Health: New wound care orders this week; continue Home Health for wound care. May utilize formulary equivalent dressing for wound treatment orders unless otherwise specified. - Change primary dressing to silver alginate on both wounds Other Home Health Orders/Instructions: - [facility] WOUND #3: - Sacrum Wound Laterality: Cleanser: Normal Saline ([place]) 1 x Per Day/7 Days Discharge Instructions: Cleanse the wound with Normal Saline or wound cleanser prior to applying a clean dressing using gauze sponges, not tissue or cotton balls. Peri-Wound Care: Skin Prep (Home Health) 1 x Per Day/7 Days Discharge Instructions: Use skin prep as directed Prim Dressing: KerraCel Ag Gelling Fiber Dressing, 2x2 in (silver alginate) (Home Health) 1 x Per Day/7 Days ary Discharge Instructions: Apply silver alginate to wound bed as instructed Secondary Dressing: Woven Gauze Sponge, Non-Sterile 4x4 in ([place]) 1 x Per Day/7 Days Discharge Instructions: Apply over primary dressing as directed. Secondary Dressing: ABD Pad, 5x9 (Home Health) 1 x Per Day/7 Days Discharge Instructions: Apply over primary dressing as directed. Secured With: 24M Medipore H Soft Cloth Surgical T 4 x 2 (in/yd) (Home Health) 1 x Per Day/7 Days ape Discharge Instructions: Secure dressing with tape as directed. WOUND #4: - Gluteus Wound Laterality: Right Cleanser: Normal Saline (Home Health) 1 x Per Day/7 Days Discharge Instructions: Cleanse the wound with Normal Saline or wound cleanser prior to applying a clean dressing using gauze sponges, not tissue or cotton balls. Peri-Wound Care: Skin Prep (Home Health) 1 x Per Day/7 Days Discharge Instructions: Use  skin prep as directed Prim Dressing: KerraCel Ag Gelling Fiber Dressing, 2x2 in (silver alginate) (Home Health) 1 x Per Day/7 Days ary Discharge Instructions: Apply silver alginate to wound bed as instructed Secondary Dressing: Woven Gauze Sponge, Non-Sterile 4x4 in ([place]) 1 x Per Day/7 Days Discharge Instructions: Apply over primary dressing as directed. Secondary Dressing: ABD Pad,  5x9 (Home Health) 1 x Per Day/7 Days Discharge Instructions: Apply over primary dressing as directed. Secured With: 62M Medipore H Soft Cloth Surgical T 4 x 2 (in/yd) (Home Health) 1 x Per Day/7 Days ape Discharge Instructions: Secure dressing with tape as directed. 1. I change the dressing to silver alginate 2. The area around the buttock was erythematous moist and it looks like it has been dealing with drainage. She has a Foley catheter which is helpful. 3. They claim she is offloading this fairly recklessly not up in her wheelchair. 4. She now has a legal guardian through [facility]. Previously when she was here she was her own legal authority. 5. I have used silver alginate to both areas Electronic Signature(s) Signed: 01/29/2021 5:22:38 PM By: Linton Ham MD Entered By: Linton Ham on 01/29/2021 14:45:12 -------------------------------------------------------------------------------- HxROS Details Patient Name: Date of Service: Pamela Fuller 01/29/2021 1:15 PM Medical Record Number: [id number] Patient Account Number: [id number] Date of Birth/Sex: Treating RN: January 25, 1981 (40 y.o. Tonita Phoenix, Lauren Primary Care Provider: Clovia Cuff Other Clinician: Referring Provider: Treating Provider/Extender: Nida Boatman in Treatment: 0 Information Obtained From Patient Constitutional Symptoms (General Health) Complaints and Symptoms: Negative for: Fatigue; Fever; Chills; Marked Weight Change Eyes Complaints and Symptoms: Negative for: Dry Eyes; Vision Changes;  Glasses / Contacts Medical History: Negative for: Cataracts; Glaucoma; Optic Neuritis Ear/Nose/Mouth/Throat Complaints and Symptoms: Negative for: Chronic sinus problems or rhinitis Medical History: Negative for: Chronic sinus problems/congestion; Middle ear problems Respiratory Complaints and Symptoms: Negative for: Chronic or frequent coughs; Shortness of Breath Medical History: Positive for: Aspiration Negative for: Asthma; Chronic Obstructive Pulmonary Disease (COPD); Pneumothorax; Sleep Apnea; Tuberculosis Cardiovascular Complaints and Symptoms: Negative for: Chest pain Medical History: Positive for: Hypertension Negative for: Angina; Arrhythmia; Congestive Heart Failure; Coronary Artery Disease; Deep Vein Thrombosis; Hypotension; Myocardial Infarction; Peripheral Arterial Disease; Peripheral Venous Disease; Phlebitis; Vasculitis Gastrointestinal Complaints and Symptoms: Negative for: Frequent diarrhea; Nausea; Vomiting Medical History: Positive for: Crohns Negative for: Cirrhosis ; Colitis; Hepatitis A; Hepatitis B; Hepatitis C Past Medical History Notes: IBS, celiac disease, Endocrine Complaints and Symptoms: Negative for: Heat/cold intolerance Medical History: Negative for: Type I Diabetes; Type II Diabetes Genitourinary Complaints and Symptoms: Negative for: Frequent urination Medical History: Negative for: End Stage Renal Disease Integumentary (Skin) Complaints and Symptoms: Positive for: Wounds Medical History: Negative for: History of Burn Musculoskeletal Complaints and Symptoms: Negative for: Muscle Pain; Muscle Weakness Medical History: Positive for: Rheumatoid Arthritis Negative for: Gout; Osteoarthritis; Osteomyelitis Past Medical History Notes: cerebral palsy, Neurologic Complaints and Symptoms: Negative for: Numbness/parasthesias Medical History: Positive for: Quadriplegia; Paraplegia Negative for: Dementia; Neuropathy; Seizure  Disorder Psychiatric Complaints and Symptoms: Negative for: Claustrophobia; Suicidal Medical History: Negative for: Anorexia/bulimia; Confinement Anxiety Hematologic/Lymphatic Medical History: Negative for: Anemia; Hemophilia; Human Immunodeficiency Virus; Lymphedema; Sickle Cell Disease Immunological Medical History: Negative for: Lupus Erythematosus; Raynauds; Scleroderma Past Medical History Notes: hx of catheter Oncologic Medical History: Negative for: Received Chemotherapy; Received Radiation Immunizations Pneumococcal Vaccine: Received Pneumococcal Vaccination: No Implantable Devices None Family and Social History Cancer: Yes - Maternal Grandparents,Paternal Grandparents; Diabetes: No; Heart Disease: No; Hereditary Spherocytosis: No; Hypertension: Yes - Maternal Grandparents; Kidney Disease: No; Lung Disease: Yes - Maternal Grandparents; Seizures: No; Stroke: No; Thyroid Problems: Yes - Paternal Grandparents; Tuberculosis: No; Never smoker; Marital Status - Divorced; Alcohol Use: Never; Drug Use: No History; Caffeine Use: Daily; Financial Concerns: No; Food, Clothing or Shelter Needs: No; Support System Lacking: No; Transportation Concerns: No Electronic Signature(s) Signed: 01/29/2021 5:14:32 PM By: Hollie Salk,  Lauren RN Signed: 01/29/2021 5:22:38 PM By: Linton Ham MD Entered By: Rhae Hammock on 01/29/2021 13:44:38 -------------------------------------------------------------------------------- SuperBill Details Patient Name: Date of Service: Pamela Fuller 01/29/2021 Medical Record Number: [id number] Patient Account Number: [id number] Date of Birth/Sex: Treating RN: [birth year] (40 y.o. 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 Facility Procedures CPT4 Code: [id number] Description: [place] VISIT-LEV 3 EST PT Modifier: 25 Quantity: 1 CPT4 Code: [id number] Description: 65790 - DEB SUBQ TISSUE 20 SQ CM/< ICD-10 Diagnosis Description L89.312 Pressure ulcer of right buttock, stage 2 Modifier: Quantity: 1 Physician Procedures : CPT4 Code Description Modifier [id number] - WC PHYS LEVEL 3 - EST PT 25 ICD-10 Diagnosis Description L89.152 Pressure ulcer of sacral region, 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

## 2021-01-29 NOTE — Progress Notes (Addendum)
Pamela, Fuller ([id number]) Visit Report for 01/29/2021 Allergy List Details Patient Name: Date of Service: Pamela Fuller 01/29/2021 1:15 PM Medical Record Number: [id number] Patient Account Number: [id number] Date of Birth/Sex: Treating RN: [birth year] (40 y.o. Tonita Phoenix, Lauren Primary Care Brycen Bean: Clovia Cuff Other Clinician: Referring Tabitha Tupper: Treating Reisha Wos/Extender: Nida Boatman in Treatment: 0 Allergies Active Allergies Sulfa (Sulfonamide Antibiotics) Quinolones colace Type: Medication codeine lactose Type: Medication Allergy Notes Electronic Signature(s) Signed: 01/29/2021 5:14:32 PM By: Rhae Hammock RN Entered By: Rhae Hammock on 01/29/2021 13:42:28 -------------------------------------------------------------------------------- Arrival Information Details Patient Name: Date of Service: Pamela Fuller 01/29/2021 1:15 PM Medical Record Number: [id number] Patient Account Number: [id number] Date of Birth/Sex: Treating RN: [birth year] (40 y.o. Tonita Phoenix, Lauren Primary Care Fontella Shan: Clovia Cuff Other Clinician: Referring Doni Bacha: Treating Vana Arif/Extender: Nida Boatman in Treatment: 0 Visit Information Patient Arrived: Wheel Chair Arrival Time: 13:40 Accompanied By: Mady Gemma Transfer Assistance: Hoyer Lift Patient Identification Verified: Yes Secondary Verification Process Completed: Yes Patient Requires Transmission-Based Precautions: No Patient Has Alerts: No History Since Last Visit Added or deleted any medications: No Any new allergies or adverse reactions: No Had a fall or experienced change in activities of daily living that may affect risk of falls: No Signs or symptoms of abuse/neglect since last visito No Hospitalized since last visit: No Implantable device outside of the clinic excluding cellular tissue based products placed in the center since last visit: No Electronic  Signature(s) Signed: 01/29/2021 5:14:32 PM By: Rhae Hammock RN Signed: 01/29/2021 5:14:32 PM By: Rhae Hammock RN Entered By: Rhae Hammock on 01/29/2021 13:41:15 -------------------------------------------------------------------------------- Clinic Level of Care Assessment Details Patient Name: Date of Service: Pamela Fuller 01/29/2021 1:15 PM Medical Record Number: [id number] Patient Account Number: [id number] Date of Birth/Sex: Treating RN: [birth year] (40 y.o. Nancy Fetter Primary Care Krislyn Donnan: Clovia Cuff Other Clinician: Referring Taylan Mayhan: Treating Thuan Tippett/Extender: Nida Boatman in Treatment: 0 Clinic Level of Care Assessment Items TOOL 1 Quantity Score X- 1 0 Use when EandM and Procedure is performed on INITIAL visit ASSESSMENTS - Nursing Assessment / Reassessment X- 1 20 General Physical Exam (combine w/ comprehensive assessment (listed just below) when performed on new pt. evals) X- 1 25 Comprehensive Assessment (HX, ROS, Risk Assessments, Wounds Hx, etc.) ASSESSMENTS - Wound and Skin Assessment / Reassessment []  - 0 Dermatologic / Skin Assessment (not related to wound area) ASSESSMENTS - Ostomy and/or Continence Assessment and Care []  - 0 Incontinence Assessment and Management []  - 0 Ostomy Care Assessment and Management (repouching, etc.) PROCESS - Coordination of Care X - Simple Patient / Family Education for ongoing care 1 15 []  - 0 Complex (extensive) Patient / Family Education for ongoing care X- 1 10 Staff obtains Programmer, systems, Records, T Results / Process Orders est X- 1 10 Staff telephones HHA, Nursing Homes / Clarify orders / etc []  - 0 Routine Transfer to another Facility (non-emergent condition) []  - 0 Routine Hospital Admission (non-emergent condition) X- 1 15 New Admissions / Biomedical engineer / Ordering NPWT Apligraf, etc. , []  - 0 Emergency Hospital Admission (emergent condition) PROCESS -  Special Needs []  - 0 Pediatric / Minor Patient Management []  - 0 Isolation Patient Management []  - 0 Hearing / Language / Visual special needs []  - 0 Assessment of Community assistance (transportation, D/C planning, etc.) []  - 0 Additional assistance / Altered mentation []  - 0 Support Surface(s) Assessment (bed, cushion, seat, etc.) INTERVENTIONS - Miscellaneous []  - 0 External  ear exam []  - 0 Patient Transfer (multiple staff / Civil Service fast streamer / Similar devices) []  - 0 Simple Staple / Suture removal (25 or less) []  - 0 Complex Staple / Suture removal (26 or more) []  - 0 Hypo/Hyperglycemic Management (do not check if billed separately) []  - 0 Ankle / Brachial Index (ABI) - do not check if billed separately Has the patient been seen at the hospital within the last three years: Yes Total Score: 95 Level Of Care: New/Established - Level 3 Electronic Signature(s) Signed: 01/29/2021 5:28:50 PM By: Levan Hurst RN, BSN Entered By: Levan Hurst on 01/29/2021 15:33:16 -------------------------------------------------------------------------------- Encounter Discharge Information Details Patient Name: Date of Service: Pamela Fuller 01/29/2021 1:15 PM Medical Record Number: [id number] Patient Account Number: [id number] Date of Birth/Sex: Treating RN: February 10, 1981 (40 y.o. Helene Shoe, Tammi Klippel Primary Care Ardelia Wrede: Clovia Cuff Other Clinician: Referring Nusaybah Ivie: Treating Jennet Scroggin/Extender: Nida Boatman in Treatment: 0 Encounter Discharge Information Items Post Procedure Vitals Discharge Condition: Stable Temperature (F): 98.4 Ambulatory Status: Wheelchair Respiratory Rate (breaths/min): 17 Discharge Destination: Home Unable to obtain vitals Reason: unable to obtain Transportation: Private Auto Accompanied By: caregiver Schedule Follow-up Appointment: Yes Clinical Summary of Care: Electronic Signature(s) Signed: 01/29/2021 5:16:32 PM By: Deon Pilling Entered By: Deon Pilling on 01/29/2021 15:45:18 -------------------------------------------------------------------------------- Lower Extremity Assessment Details Patient Name: Date of Service: Pamela Fuller 01/29/2021 1:15 PM Medical Record Number: [id number] Patient Account Number: [id number] Date of Birth/Sex: Treating RN: [birth year] (40 y.o. Tonita Phoenix, Lauren Primary Care Kayne Yuhas: Clovia Cuff Other Clinician: Referring Yotam Rhine: Treating Darnise Montag/Extender: Nida Boatman in Treatment: 0 Electronic Signature(s) Signed: 01/29/2021 5:14:32 PM By: Rhae Hammock RN Entered By: Rhae Hammock on 01/29/2021 13:56:40 -------------------------------------------------------------------------------- Multi Wound Chart Details Patient Name: Date of Service: Pamela Fuller 01/29/2021 1:15 PM Medical Record Number: [id number] Patient Account Number: [id number] Date of Birth/Sex: Treating RN: [birth year] (40 y.o. Nancy Fetter Primary Care Marrion Finan: Clovia Cuff Other Clinician: Referring Marteze Vecchio: Treating Armend Hochstatter/Extender: Nida Boatman in Treatment: 0 Vital Signs Height(in): 61 Pulse(bpm): Weight(lbs): 110 Blood Pressure(mmHg): Body Mass Index(BMI): 21 Temperature(F): 98.4 Respiratory Rate(breaths/min): 17 Photos: [3:No Photos Sacrum] [4:No Photos Right Gluteus] [N/A:N/A N/A] Wound Location: [3:Gradually Appeared] [4:Gradually Appeared] [N/A:N/A] Wounding Event: [3:Pressure Ulcer] [4:Pressure Ulcer] [N/A:N/A] Primary Etiology: [3:Aspiration, Hypertension, Crohns,] [4:Aspiration, Hypertension, Crohns,] [N/A:N/A] Comorbid History: [3:Rheumatoid Arthritis, Quadriplegia, Paraplegia 11/01/2020] [4:Rheumatoid Arthritis, Quadriplegia, Paraplegia 11/03/2020] [N/A:N/A] Date Acquired: [3:0] [4:0] [N/A:N/A] Weeks of Treatment: [3:Open] [4:Open] [N/A:N/A] Wound Status: [3:2.2x0.5x0.1] [4:3x2.2x0.1]  [N/A:N/A] Measurements L x W x D (cm) [3:0.864] [4:5.184] [N/A:N/A] A (cm) : rea [3:0.086] [4:0.518] [N/A:N/A] Volume (cm) : [3:Category/Stage II] [4:Category/Stage II] [N/A:N/A] Classification: [3:Medium] [4:Medium] [N/A:N/A] Exudate A mount: [3:Serosanguineous] [4:Serosanguineous] [N/A:N/A] Exudate Type: [3:red, brown] [4:red, brown] [N/A:N/A] Exudate Color: [3:Distinct, outline attached] [4:Distinct, outline attached] [N/A:N/A] Wound Margin: [3:Large (67-100%)] [4:Large (67-100%)] [N/A:N/A] Granulation A mount: [3:Red, Pink] [4:Red, Pink] [N/A:N/A] Granulation Quality: [3:Small (1-33%)] [4:Small (1-33%)] [N/A:N/A] Necrotic A mount: [3:Fascia: No] [4:Fascia: No] [N/A:N/A] Exposed Structures: [3:Fat Layer (Subcutaneous Tissue): No Tendon: No Muscle: No Joint: No Bone: No Small (1-33%)] [4:Fat Layer (Subcutaneous Tissue): No Tendon: No Muscle: No Joint: No Bone: No Small (1-33%)] [N/A:N/A] Epithelialization: [3:N/A] [4:Debridement - Excisional] [N/A:N/A] Debridement: Pre-procedure Verification/Time Out N/A [4:14:30] [N/A:N/A] Taken: [3:N/A] [4:Subcutaneous, Slough] [N/A:N/A] Tissue Debrided: [3:N/A] [4:Skin/Subcutaneous Tissue] [N/A:N/A] Level: [3:N/A] [4:6.6] [N/A:N/A] Debridement A (sq cm): [3:rea N/A] [4:Curette] [N/A:N/A] Instrument: [3:N/A] [4:Moderate] [N/A:N/A] Bleeding: [3:N/A] [4:Pressure] [N/A:N/A] Hemostasis A chieved: [3:N/A] [4:0] [N/A:N/A] Procedural Pain: [3:N/A] [  4:0] [N/A:N/A] Post Procedural Pain: [3:N/A] [4:Procedure was tolerated well] [N/A:N/A] Debridement Treatment Response: [3:N/A] [4:3x2.2x0.1] [N/A:N/A] Post Debridement Measurements L x W x D (cm) [3:N/A] [4:0.518] [N/A:N/A] Post Debridement Volume: (cm) [3:N/A] [4:Category/Stage II] [N/A:N/A] Post Debridement Stage: [3:N/A] [4:Debridement] [N/A:N/A] Treatment Notes Electronic Signature(s) Signed: 01/29/2021 5:22:38 PM By: Linton Ham MD Signed: 01/29/2021 5:28:50 PM By: Levan Hurst RN,  BSN Entered By: Linton Ham on 01/29/2021 14:39:17 -------------------------------------------------------------------------------- Multi-Disciplinary Care Plan Details Patient Name: Date of Service: Pamela Fuller 01/29/2021 1:15 PM Medical Record Number: [id number] Patient Account Number: [id number] Date of Birth/Sex: Treating RN: 21-Feb-1981 (40 y.o. Nancy Fetter Primary Care Genny Caulder: Clovia Cuff Other Clinician: Referring Otilia Kareem: Treating Maevis Mumby/Extender: Nida Boatman in Treatment: 0 Multidisciplinary Care Plan reviewed with physician Active Inactive Pressure Nursing Diagnoses: Knowledge deficit related to causes and risk factors for pressure ulcer development Knowledge deficit related to management of pressures ulcers Potential for impaired tissue integrity related to pressure, friction, moisture, and shear Goals: Patient/caregiver will verbalize risk factors for pressure ulcer development Date Initiated: 01/29/2021 Target Resolution Date: 03/09/2021 Goal Status: Active Patient/caregiver will verbalize understanding of pressure ulcer management Date Initiated: 01/29/2021 Target Resolution Date: 03/09/2021 Goal Status: Active Interventions: Assess: immobility, friction, shearing, incontinence upon admission and as needed Assess offloading mechanisms upon admission and as needed Assess potential for pressure ulcer upon admission and as needed Provide education on pressure ulcers Notes: Wound/Skin Impairment Nursing Diagnoses: Impaired tissue integrity Knowledge deficit related to ulceration/compromised skin integrity Goals: Patient/caregiver will verbalize understanding of skin care regimen Date Initiated: 01/29/2021 Target Resolution Date: 03/09/2021 Goal Status: Active Ulcer/skin breakdown will have a volume reduction of 30% by week 4 Date Initiated: 01/29/2021 Target Resolution Date: 03/09/2021 Goal Status:  Active Interventions: Assess patient/caregiver ability to obtain necessary supplies Assess patient/caregiver ability to perform ulcer/skin care regimen upon admission and as needed Assess ulceration(s) every visit Provide education on ulcer and skin care Notes: Electronic Signature(s) Signed: 01/29/2021 5:28:50 PM By: Levan Hurst RN, BSN Entered By: Levan Hurst on 01/29/2021 14:30:57 -------------------------------------------------------------------------------- Pain Assessment Details Patient Name: Date of Service: Pamela Fuller 01/29/2021 1:15 PM Medical Record Number: [id number] Patient Account Number: [id number] Date of Birth/Sex: Treating RN: [birth year] (40 y.o. Tonita Phoenix, Lauren Primary Care Spruha Weight: Clovia Cuff Other Clinician: Referring Frimy Uffelman: Treating Anshu Wehner/Extender: Nida Boatman in Treatment: 0 Active Problems Location of Pain Severity and Description of Pain Patient Has Paino No Site Locations Pain Management and Medication Current Pain Management: Electronic Signature(s) Signed: 01/29/2021 5:14:32 PM By: Rhae Hammock RN Entered By: Rhae Hammock on 01/29/2021 13:56:50 -------------------------------------------------------------------------------- Patient/Caregiver Education Details Patient Name: Date of Service: Pamela Fuller 3/28/2022andnbsp1:15 PM Medical Record Number: [id number] Patient Account Number: [id number] Date of Birth/Gender: Treating RN: 08-16-1981 (40 y.o. Nancy Fetter Primary Care Physician: Clovia Cuff Other Clinician: Referring Physician: Treating Physician/Extender: Nida Boatman in Treatment: 0 Education Assessment Education Provided To: Patient Education Topics Provided Pressure: Methods: Explain/Verbal Responses: State content correctly Wound/Skin Impairment: Methods: Explain/Verbal Responses: State content correctly Electronic  Signature(s) Signed: 01/29/2021 5:28:50 PM By: Levan Hurst RN, BSN Entered By: Levan Hurst on 01/29/2021 15:32:37 -------------------------------------------------------------------------------- Wound Assessment Details Patient Name: Date of Service: Pamela Fuller 01/29/2021 1:15 PM Medical Record Number: [id number] Patient Account Number: [id number] Date of Birth/Sex: Treating RN: [birth year] (40 y.o. Tonita Phoenix, Lauren Primary Care Khole Branch: Clovia Cuff Other Clinician: Referring Aristide Waggle: Treating Chanah Tidmore/Extender: Nida Boatman in Treatment: 0 Wound Status  Wound Number: 3 Primary Pressure Ulcer Etiology: Wound Location: Sacrum Wound Open Wounding Event: Gradually Appeared Status: Date Acquired: 11/01/2020 Comorbid Aspiration, Hypertension, Crohns, Rheumatoid Arthritis, Weeks Of Treatment: 0 History: Quadriplegia, Paraplegia Clustered Wound: No Photos Wound Measurements Length: (cm) 2.2 Width: (cm) 0.5 Depth: (cm) 0.1 Area: (cm) 0.864 Volume: (cm) 0.086 % Reduction in Area: 0% % Reduction in Volume: 0% Epithelialization: Small (1-33%) Tunneling: No Undermining: No Wound Description Classification: Category/Stage II Wound Margin: Distinct, outline attached Exudate Amount: Medium Exudate Type: Serosanguineous Exudate Color: red, brown Foul Odor After Cleansing: No Slough/Fibrino Yes Wound Bed Granulation Amount: Large (67-100%) Exposed Structure Granulation Quality: Red, Pink Fascia Exposed: No Necrotic Amount: Small (1-33%) Fat Layer (Subcutaneous Tissue) Exposed: No Tendon Exposed: No Muscle Exposed: No Joint Exposed: No Bone Exposed: No Treatment Notes Wound #3 (Sacrum) Cleanser Normal Saline Discharge Instruction: Cleanse the wound with Normal Saline or wound cleanser prior to applying a clean dressing using gauze sponges, not tissue or cotton balls. Peri-Wound Care Skin Prep Discharge Instruction: Use skin  prep as directed Topical Primary Dressing KerraCel Ag Gelling Fiber Dressing, 2x2 in (silver alginate) Discharge Instruction: Apply silver alginate to wound bed as instructed Secondary Dressing Woven Gauze Sponge, Non-Sterile 4x4 in Discharge Instruction: Apply over primary dressing as directed. ABD Pad, 5x9 Discharge Instruction: Apply over primary dressing as directed. Secured With 62M Medipore H Soft Cloth Surgical T 4 x 2 (in/yd) ape Discharge Instruction: Secure dressing with tape as directed. Compression Wrap Compression Stockings Add-Ons Electronic Signature(s) Signed: 01/30/2021 5:10:40 PM By: Rhae Hammock RN Signed: 01/31/2021 9:05:34 AM By: Sandre Kitty Previous Signature: 01/29/2021 5:14:32 PM Version By: Rhae Hammock RN Entered By: Sandre Kitty on 01/30/2021 16:18:03 -------------------------------------------------------------------------------- Wound Assessment Details Patient Name: Date of Service: Pamela Fuller 01/29/2021 1:15 PM Medical Record Number: [id number] Patient Account Number: [id number] Date of Birth/Sex: Treating RN: 09/16/1981 (40 y.o. Tonita Phoenix, Lauren Primary Care Iona Stay: Clovia Cuff Other Clinician: Referring Rosalind Guido: Treating Tristian Sickinger/Extender: Nida Boatman in Treatment: 0 Wound Status Wound Number: 4 Primary Pressure Ulcer Etiology: Wound Location: Right Gluteus Wound Open Wounding Event: Gradually Appeared Status: Date Acquired: 11/03/2020 Comorbid Aspiration, Hypertension, Crohns, Rheumatoid Arthritis, Weeks Of Treatment: 0 History: Quadriplegia, Paraplegia Clustered Wound: No Photos Wound Measurements Length: (cm) 3 Width: (cm) 2.2 Depth: (cm) 0.1 Area: (cm) 5.184 Volume: (cm) 0.518 % Reduction in Area: 0% % Reduction in Volume: 0% Epithelialization: Small (1-33%) Tunneling: No Undermining: No Wound Description Classification: Category/Stage II Wound Margin:  Distinct, outline attached Exudate Amount: Medium Exudate Type: Serosanguineous Exudate Color: red, brown Wound Bed Granulation Amount: Large (67-100%) Granulation Quality: Red, Pink Necrotic Amount: Small (1-33%) Necrotic Quality: Adherent Slough Foul Odor After Cleansing: No Slough/Fibrino Yes Exposed Structure Fascia Exposed: No Fat Layer (Subcutaneous Tissue) Exposed: No Tendon Exposed: No Muscle Exposed: No Joint Exposed: No Bone Exposed: No Treatment Notes Wound #4 (Gluteus) Wound Laterality: Right Cleanser Normal Saline Discharge Instruction: Cleanse the wound with Normal Saline or wound cleanser prior to applying a clean dressing using gauze sponges, not tissue or cotton balls. Peri-Wound Care Skin Prep Discharge Instruction: Use skin prep as directed Topical Primary Dressing KerraCel Ag Gelling Fiber Dressing, 2x2 in (silver alginate) Discharge Instruction: Apply silver alginate to wound bed as instructed Secondary Dressing Woven Gauze Sponge, Non-Sterile 4x4 in Discharge Instruction: Apply over primary dressing as directed. ABD Pad, 5x9 Discharge Instruction: Apply over primary dressing as directed. Secured With 62M Medipore H Soft Cloth Surgical T 4 x 2 (in/yd) ape Discharge Instruction:  Secure dressing with tape as directed. Compression Wrap Compression Stockings Add-Ons Electronic Signature(s) Signed: 01/30/2021 5:10:40 PM By: Rhae Hammock RN Signed: 01/31/2021 9:05:34 AM By: Sandre Kitty Previous Signature: 01/29/2021 5:14:32 PM Version By: Rhae Hammock RN Entered By: Sandre Kitty on 01/30/2021 16:18:37 -------------------------------------------------------------------------------- Vitals Details Patient Name: Date of Service: Pamela Fuller 01/29/2021 1:15 PM Medical Record Number: [id number] Patient Account Number: [id number] Date of Birth/Sex: Treating RN: 10-May-1981 (40 y.o. Tonita Phoenix, Lauren Primary Care Theus Espin: Clovia Cuff Other Clinician: Referring Kashaun Bebo: Treating Jachai Okazaki/Extender: Nida Boatman in Treatment: 0 Vital Signs Time Taken: 13:41 Temperature (F): 98.4 Height (in): 61 Respiratory Rate (breaths/min): 17 Source: Stated Reference Range: 80 - 120 mg / dl Weight (lbs): 110 Source: Stated Body Mass Index (BMI): 20.8 Electronic Signature(s) Signed: 01/29/2021 5:14:32 PM By: Rhae Hammock RN Entered By: Rhae Hammock on 01/29/2021 13:42:02

## 2021-01-29 NOTE — Progress Notes (Signed)
TERREL, MANALO ([id number]) Visit Report for 01/29/2021 Abuse/Suicide Risk Screen Details Patient Name: Date of Service: Pamela Fuller 01/29/2021 1:15 PM Medical Record Number: [id number] Patient Account Number: [id number] Date of Birth/Sex: Treating RN: Jan 01, 1981 (40 y.o. Tonita Phoenix, Lauren Primary Care Jinnifer Montejano: Clovia Cuff Other Clinician: Referring Tamella Tuccillo: Treating Rashawna Scoles/Extender: Nida Boatman in Treatment: 0 Abuse/Suicide Risk Screen Items Answer ABUSE RISK SCREEN: Has anyone close to you tried to hurt or harm you recentlyo No Do you feel uncomfortable with anyone in your familyo No Has anyone forced you do things that you didnt want to doo No Electronic Signature(s) Signed: 01/29/2021 5:14:32 PM By: Rhae Hammock RN Entered By: Rhae Hammock on 01/29/2021 13:45:02 -------------------------------------------------------------------------------- Activities of Daily Living Details Patient Name: Date of Service: Pamela Fuller 01/29/2021 1:15 PM Medical Record Number: [id number] Patient Account Number: [id number] Date of Birth/Sex: Treating RN: 04-Jun-1981 (40 y.o. Tonita Phoenix, Lauren Primary Care Riannah Stagner: Clovia Cuff Other Clinician: Referring Eulene Pekar: Treating Telisa Ohlsen/Extender: Nida Boatman in Treatment: 0 Activities of Daily Living Items Answer Activities of Daily Living (Please select one for each item) Drive Automobile Not Able T Medications ake Need Assistance Use T elephone Need Assistance Care for Appearance Need Assistance Use T oilet Need Assistance Bath / Shower Need Assistance Dress Self Need Assistance Feed Self Need Assistance Walk Not Able Get In / Out Bed Need Assistance Housework Need Assistance Prepare Meals Need Assistance Handle Money Need Assistance Shop for Self Need Assistance Electronic Signature(s) Signed: 01/29/2021 5:14:32 PM By: Rhae Hammock RN Entered  By: Rhae Hammock on 01/29/2021 13:45:29 -------------------------------------------------------------------------------- Education Screening Details Patient Name: Date of Service: Pamela Fuller 01/29/2021 1:15 PM Medical Record Number: [id number] Patient Account Number: [id number] Date of Birth/Sex: Treating RN: 06/09/1981 (40 y.o. Tonita Phoenix, Lauren Primary Care Betta Balla: Clovia Cuff Other Clinician: Referring Yobany Vroom: Treating Mackie Holness/Extender: Nida Boatman in Treatment: 0 Primary Learner Assessed: Patient Learning Preferences/Education Level/Primary Language Learning Preference: Explanation, Demonstration, Communication Board, Printed Material Highest Education Level: Grade School Preferred Language: English Cognitive Barrier Language Barrier: No Translator Needed: No Memory Deficit: No Emotional Barrier: No Cultural/Religious Beliefs Affecting Medical Care: No Physical Barrier Impaired Vision: No Impaired Hearing: No Decreased Hand dexterity: Yes Limitations: contracted Knowledge/Comprehension Knowledge Level: High Comprehension Level: High Ability to understand written instructions: High Ability to understand verbal instructions: High Motivation Anxiety Level: Calm Cooperation: Cooperative Education Importance: Denies Need Interest in Health Problems: Asks Questions Perception: Coherent Willingness to Engage in Self-Management High Activities: Readiness to Engage in Self-Management High Activities: Electronic Signature(s) Signed: 01/29/2021 5:14:32 PM By: Rhae Hammock RN Entered By: Rhae Hammock on 01/29/2021 13:56:02 -------------------------------------------------------------------------------- Fall Risk Assessment Details Patient Name: Date of Service: Pamela Fuller 01/29/2021 1:15 PM Medical Record Number: [id number] Patient Account Number: [id number] Date of Birth/Sex: Treating RN: 05/06/1981 (40  y.o. Tonita Phoenix, Lauren Primary Care Demonie Kassa: Clovia Cuff Other Clinician: Referring Lilliana Turner: Treating Minyon Billiter/Extender: Nida Boatman in Treatment: 0 Fall Risk Assessment Items Have you had 2 or more falls in the last 12 monthso 0 No Have you had any fall that resulted in injury in the last 12 monthso 0 No FALLS RISK SCREEN History of falling - immediate or within 3 months 0 No Secondary diagnosis (Do you have 2 or more medical diagnoseso) 0 No Ambulatory aid None/bed rest/wheelchair/nurse 0 No Crutches/cane/walker 0 No Furniture 0 No Intravenous therapy Access/Saline/Heparin Lock 0 No Gait/Transferring Normal/ bed rest/ wheelchair 0 No  Weak (short steps with or without shuffle, stooped but able to lift head while walking, may seek 0 No support from furniture) Impaired (short steps with shuffle, may have difficulty arising from chair, head down, impaired 0 No balance) Mental Status Oriented to own ability 0 No Electronic Signature(s) Signed: 01/29/2021 5:14:32 PM By: Rhae Hammock RN Entered By: Rhae Hammock on 01/29/2021 13:56:09 -------------------------------------------------------------------------------- Foot Assessment Details Patient Name: Date of Service: Pamela Fuller 01/29/2021 1:15 PM Medical Record Number: [id number] Patient Account Number: [id number] Date of Birth/Sex: Treating RN: [birth year] (40 y.o. Tonita Phoenix, Lauren Primary Care Jamareon Shimel: Clovia Cuff Other Clinician: Referring Cheray Pardi: Treating Swara Donze/Extender: Nida Boatman in Treatment: 0 Foot Assessment Items Site Locations + = Sensation present, - = Sensation absent, C = Callus, U = Ulcer R = Redness, W = Warmth, M = Maceration, PU = Pre-ulcerative lesion F = Fissure, S = Swelling, D = Dryness Assessment Right: Left: Other Deformity: No No Prior Foot Ulcer: No No Prior Amputation: No No Charcot Joint: No No Ambulatory  Status: Gait: Notes N/A no LE wounds and pt. not diabetic Electronic Signature(s) Signed: 01/29/2021 5:14:32 PM By: Rhae Hammock RN Entered By: Rhae Hammock on 01/29/2021 13:56:33 -------------------------------------------------------------------------------- Nutrition Risk Screening Details Patient Name: Date of Service: Pamela Fuller 01/29/2021 1:15 PM Medical Record Number: [id number] Patient Account Number: [id number] Date of Birth/Sex: Treating RN: 02/21/1981 (40 y.o. Tonita Phoenix, Lauren Primary Care Solomia Harrell: Clovia Cuff Other Clinician: Referring Nevea Spiewak: Treating Amie Cowens/Extender: Nida Boatman in Treatment: 0 Height (in): 61 Weight (lbs): 110 Body Mass Index (BMI): 20.8 Nutrition Risk Screening Items Score Screening NUTRITION RISK SCREEN: I have an illness or condition that made me change the kind and/or amount of food I eat 0 No I eat fewer than two meals per day 0 No I eat few fruits and vegetables, or milk products 0 No I have three or more drinks of beer, liquor or wine almost every day 0 No I have tooth or mouth problems that make it hard for me to eat 0 No I don't always have enough money to buy the food I need 0 No I eat alone most of the time 0 No I take three or more different prescribed or over-the-counter drugs a day 0 No Without wanting to, I have lost or gained 10 pounds in the last six months 0 No I am not always physically able to shop, cook and/or feed myself 0 No Nutrition Protocols Good Risk Protocol 0 No interventions needed Moderate Risk Protocol High Risk Proctocol Risk Level: Good Risk Score: 0 Electronic Signature(s) Signed: 01/29/2021 5:14:32 PM By: Rhae Hammock RN Entered By: Rhae Hammock on 01/29/2021 13:56:16

## 2021-02-02 ENCOUNTER — Encounter (HOSPITAL_COMMUNITY): Payer: Self-pay | Admitting: Urology

## 2021-02-02 ENCOUNTER — Other Ambulatory Visit: Payer: Self-pay

## 2021-02-02 NOTE — Patient Instructions (Addendum)
Preop instructions for:   Pamela Fuller   Date of Birth:   06-23-1981                     Date of Procedure:  02/07/2021  Procedure:  CYSTOSCOPY WITH LITHOLAPAXY    Surgeon: Dr. Ellison Hughs Facility contact:  [facility]     Phone:  [phone]               Legal guardian DSS: Wayna Chalet [phone] [employer] contact name/phone#: Tilman Neat                         and Fax #: [phone]   Transportation contact phone#: Isidor Holts (alternative living provider/caregiver) [phone]   Time to arrive at [facility]: 11:00 AM   Report to: Admitting (On your left hand side)    Do not eat past midnight the night before your procedure.(To include any tube feedings-must be discontinued)  May have liquids until 11:00 AM day of procedure  CLEAR LIQUID DIET  Foods Allowed                                                                     Foods Excluded  Water, Black Coffee and tea, regular and decaf                             liquids that you cannot  Plain Jell-O in any flavor  (No red)                                           see through such as: Fruit ices (not with fruit pulp)                                     milk, soups, orange juice  Iced Popsicles (No red)                                    All solid food                                   Apple juices Sports drinks like Gatorade (No red) Lightly seasoned clear broth or consume(fat free) Sugar, honey syrup  Sample Menu Breakfast                                Lunch                                     Supper Cranberry juice                    Beef broth  Chicken broth Jell-O                                     Grape juice                           Apple juice Coffee or tea                        Jell-O                                      Popsicle                                                Coffee or tea                        Coffee or  tea   Take these morning medications only with sips of water.(or give through gastrostomy or feeding tube). Baclofen, Carvedilol, Gabapentin, Risperidone, Sertraline, Tramadol    Please send day of procedure:current med list and meds last taken that day, confirm nothing by mouth status from what time, Patient Demographic info( to include DNR status, problem list, allergies)   Bring Insurance card and picture ID Leave all jewelry and other valuables at place where living( no metal or rings to be worn) No contact lens Women-no make-up, no lotions,perfumes,powders    Any questions day of procedure,call  [facility]AY-[phone]     Sent from :[facility]                   [phone]                   Fax:[phone]   Sent by : Harlon Flor BSN, RN

## 2021-02-02 NOTE — Progress Notes (Signed)
History obtained via telephone with Tilman Neat, pre op instructions faxed.   I left a voicemail with Wayna Chalet, DSS guardian she will be out of the office until 4/5 2022.

## 2021-02-05 ENCOUNTER — Other Ambulatory Visit (HOSPITAL_COMMUNITY)
Admission: RE | Admit: 2021-02-05 | Discharge: 2021-02-05 | Disposition: A | Discharge status: Home / Self Care | Payer: 59 | Source: Other Acute Inpatient Hospital | Attending: Internal Medicine | Admitting: Internal Medicine

## 2021-02-05 ENCOUNTER — Encounter (HOSPITAL_BASED_OUTPATIENT_CLINIC_OR_DEPARTMENT_OTHER): Payer: 59 | Attending: Internal Medicine | Admitting: Internal Medicine

## 2021-02-05 ENCOUNTER — Other Ambulatory Visit: Payer: Self-pay

## 2021-02-05 DIAGNOSIS — Z933 Colostomy status: Secondary | ICD-10-CM | POA: Insufficient documentation

## 2021-02-05 DIAGNOSIS — I10 Essential (primary) hypertension: Secondary | ICD-10-CM | POA: Insufficient documentation

## 2021-02-05 DIAGNOSIS — L89313 Pressure ulcer of right buttock, stage 3: Secondary | ICD-10-CM | POA: Insufficient documentation

## 2021-02-05 DIAGNOSIS — Z993 Dependence on wheelchair: Secondary | ICD-10-CM | POA: Diagnosis not present

## 2021-02-05 DIAGNOSIS — G800 Spastic quadriplegic cerebral palsy: Secondary | ICD-10-CM | POA: Diagnosis not present

## 2021-02-05 DIAGNOSIS — L89312 Pressure ulcer of right buttock, stage 2: Secondary | ICD-10-CM | POA: Insufficient documentation

## 2021-02-05 DIAGNOSIS — Z87440 Personal history of urinary (tract) infections: Secondary | ICD-10-CM | POA: Insufficient documentation

## 2021-02-05 DIAGNOSIS — L89152 Pressure ulcer of sacral region, stage 2: Secondary | ICD-10-CM | POA: Insufficient documentation
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
## 2021-02-05 NOTE — Progress Notes (Addendum)
Pamela Fuller, Pamela Fuller ([id number]) Visit Report for 02/05/2021 Arrival Information Details Patient Name: Date of Service: Pamela Fuller 02/05/2021 3:15 PM Medical Record Number: [id number] Patient Account Number: [id number] Date of Birth/Sex: Treating RN: [birth year] (40 y.o. Helene Shoe, Tammi Klippel Primary Care Yoshika Vensel: Clovia Cuff Other Clinician: Referring Sumaiyah Markert: Treating Issaic Welliver/Extender: Nida Boatman in Treatment: 1 Visit Information History Since Last Visit Added or deleted any medications: No Patient Arrived: Wheel Chair Any new allergies or adverse reactions: No Arrival Time: 15:45 Had a fall or experienced change in No Accompanied By: caregiver activities of daily living that may affect Transfer Assistance: None risk of falls: Patient Identification Verified: Yes Signs or symptoms of abuse/neglect since last visito No Secondary Verification Process Completed: Yes Hospitalized since last visit: No Patient Requires Transmission-Based Precautions: No Implantable device outside of the clinic excluding No Patient Has Alerts: No cellular tissue based products placed in the center since last visit: Has Dressing in Place as Prescribed: Yes Pain Present Now: No Electronic Signature(s) Signed: 02/05/2021 4:57:52 PM By: Deon Pilling Entered By: Deon Pilling on 02/05/2021 15:56:20 -------------------------------------------------------------------------------- Clinic Level of Care Assessment Details Patient Name: Date of Service: Pamela Fuller 02/05/2021 3:15 PM Medical Record Number: [id number] Patient Account Number: [id number] Date of Birth/Sex: Treating RN: Mar 25, 1981 (40 y.o. Nancy Fetter Primary Care Regnald Bowens: Clovia Cuff Other Clinician: Referring Emileigh Kellett: Treating Tashon Capp/Extender: Nida Boatman in Treatment: 1 Clinic Level of Care Assessment Items TOOL 4 Quantity Score X- 1 0 Use when only an EandM is  performed on FOLLOW-UP visit ASSESSMENTS - Nursing Assessment / Reassessment X- 1 10 Reassessment of Co-morbidities (includes updates in patient status) X- 1 5 Reassessment of Adherence to Treatment Plan ASSESSMENTS - Wound and Skin A ssessment / Reassessment []  - 0 Simple Wound Assessment / Reassessment - one wound X- 2 5 Complex Wound Assessment / Reassessment - multiple wounds []  - 0 Dermatologic / Skin Assessment (not related to wound area) ASSESSMENTS - Focused Assessment []  - 0 Circumferential Edema Measurements - multi extremities []  - 0 Nutritional Assessment / Counseling / Intervention []  - 0 Lower Extremity Assessment (monofilament, tuning fork, pulses) []  - 0 Peripheral Arterial Disease Assessment (using hand held doppler) ASSESSMENTS - Ostomy and/or Continence Assessment and Care []  - 0 Incontinence Assessment and Management []  - 0 Ostomy Care Assessment and Management (repouching, etc.) PROCESS - Coordination of Care X - Simple Patient / Family Education for ongoing care 1 15 []  - 0 Complex (extensive) Patient / Family Education for ongoing care X- 1 10 Staff obtains Consents, Records, T Results / Process Orders est X- 1 10 Staff telephones HHA, Nursing Homes / Clarify orders / etc []  - 0 Routine Transfer to another Facility (non-emergent condition) []  - 0 Routine Hospital Admission (non-emergent condition) []  - 0 New Admissions / Biomedical engineer / Ordering NPWT Apligraf, etc. , []  - 0 Emergency Hospital Admission (emergent condition) X- 1 10 Simple Discharge Coordination []  - 0 Complex (extensive) Discharge Coordination PROCESS - Special Needs []  - 0 Pediatric / Minor Patient Management []  - 0 Isolation Patient Management []  - 0 Hearing / Language / Visual special needs []  - 0 Assessment of Community assistance (transportation, D/C planning, etc.) []  - 0 Additional assistance / Altered mentation []  - 0 Support Surface(s) Assessment  (bed, cushion, seat, etc.) INTERVENTIONS - Wound Cleansing / Measurement []  - 0 Simple Wound Cleansing - one wound X- 2 5 Complex Wound Cleansing - multiple wounds X- 1  5 Wound Imaging (photographs - any number of wounds) []  - 0 Wound Tracing (instead of photographs) []  - 0 Simple Wound Measurement - one wound X- 2 5 Complex Wound Measurement - multiple wounds INTERVENTIONS - Wound Dressings []  - 0 Small Wound Dressing one or multiple wounds X- 1 15 Medium Wound Dressing one or multiple wounds []  - 0 Large Wound Dressing one or multiple wounds X- 1 5 Application of Medications - topical []  - 0 Application of Medications - injection INTERVENTIONS - Miscellaneous []  - 0 External ear exam X- 1 5 Specimen Collection (cultures, biopsies, blood, body fluids, etc.) X- 1 5 Specimen(s) / Culture(s) sent or taken to Lab for analysis X- 1 10 Patient Transfer (multiple staff / Harrel Lemon Lift / Similar devices) []  - 0 Simple Staple / Suture removal (25 or less) []  - 0 Complex Staple / Suture removal (26 or more) []  - 0 Hypo / Hyperglycemic Management (close monitor of Blood Glucose) []  - 0 Ankle / Brachial Index (ABI) - do not check if billed separately X- 1 5 Vital Signs Has the patient been seen at the hospital within the last three years: Yes Total Score: 140 Level Of Care: New/Established - Level 4 Electronic Signature(s) Signed: 02/05/2021 5:00:21 PM By: Levan Hurst RN, BSN Entered By: Levan Hurst on 02/05/2021 16:53:17 -------------------------------------------------------------------------------- Encounter Discharge Information Details Patient Name: Date of Service: Pamela Fuller 02/05/2021 3:15 PM Medical Record Number: [id number] Patient Account Number: [id number] Date of Birth/Sex: Treating RN: June 24, 1981 (40 y.o. Helene Shoe, Tammi Klippel Primary Care Shenice Dolder: Clovia Cuff Other Clinician: Referring Kanoelani Dobies: Treating Maddalynn Barnard/Extender: Nida Boatman in Treatment: 1 Encounter Discharge Information Items Discharge Condition: Stable Ambulatory Status: Wheelchair Discharge Destination: Home Transportation: Private Auto Accompanied By: caregiver Schedule Follow-up Appointment: Yes Clinical Summary of Care: Electronic Signature(s) Signed: 02/05/2021 4:57:52 PM By: Deon Pilling Entered By: Deon Pilling on 02/05/2021 16:57:23 -------------------------------------------------------------------------------- Lower Extremity Assessment Details Patient Name: Date of Service: Pamela Fuller 02/05/2021 3:15 PM Medical Record Number: [id number] Patient Account Number: [id number] Date of Birth/Sex: Treating RN: 1981-09-29 (40 y.o. Debby Bud Primary Care Caral Whan: Clovia Cuff Other Clinician: Referring Lavert Matousek: Treating Cieanna Stormes/Extender: Nida Boatman in Treatment: 1 Electronic Signature(s) Signed: 02/05/2021 4:57:52 PM By: Deon Pilling Entered By: Deon Pilling on 02/05/2021 15:58:58 -------------------------------------------------------------------------------- Multi Wound Chart Details Patient Name: Date of Service: Pamela Fuller 02/05/2021 3:15 PM Medical Record Number: [id number] Patient Account Number: [id number] Date of Birth/Sex: Treating RN: 16-Jul-1981 (40 y.o. Nancy Fetter Primary Care Srihaan Mastrangelo: Clovia Cuff Other Clinician: Referring Terrian Sentell: Treating Dawnielle Christiana/Extender: Nida Boatman in Treatment: 1 Vital Signs Height(in): 61 Pulse(bpm): 80 Weight(lbs): 110 Blood Pressure(mmHg): 110/75 Body Mass Index(BMI): 21 Temperature(F): 97.8 Respiratory Rate(breaths/min): 17 Photos: [3:No Photos Sacrum] [4:No Photos Right Gluteus] [N/A:N/A N/A] Wound Location: [3:Gradually Appeared] [4:Gradually Appeared] [N/A:N/A] Wounding Event: [3:Pressure Ulcer] [4:Pressure Ulcer] [N/A:N/A] Primary Etiology: [3:Aspiration, Hypertension, Crohns,]  [4:Aspiration, Hypertension, Crohns, N/A] Comorbid History: [3:Rheumatoid Arthritis, Quadriplegia, Rheumatoid Arthritis, Quadriplegia, Paraplegia 11/01/2020] [4:Paraplegia 11/03/2020] [N/A:N/A] Date Acquired: [3:1] [4:1] [N/A:N/A] Weeks of Treatment: [3:Open] [4:Open] [N/A:N/A] Wound Status: [3:2x0.3x0.1] [4:2x1.5x1.3] [N/A:N/A] Measurements Fuller x W x D (cm) [3:0.471] [4:2.356] [N/A:N/A] A (cm) : rea [3:0.047] [4:3.063] [N/A:N/A] Volume (cm) : [3:45.50%] [4:54.60%] [N/A:N/A] % Reduction in A rea: [3:45.30%] [4:-491.30%] [N/A:N/A] % Reduction in Volume: [4:11] Starting Position 1 (o'clock): [4:7] Ending Position 1 (o'clock): [4:1.5] Maximum Distance 1 (cm): [3:No] [4:Yes] [N/A:N/A] Undermining: [3:Category/Stage II] [4:Category/Stage III] [N/A:N/A] Classification: [3:Medium] [4:Medium] [  N/A:N/A] Exudate A mount: [3:Serosanguineous] [4:Serosanguineous] [N/A:N/A] Exudate Type: [3:red, brown] [4:red, brown] [N/A:N/A] Exudate Color: [3:Distinct, outline attached] [4:Distinct, outline attached] [N/A:N/A] Wound Margin: [3:Large (67-100%)] [4:Large (67-100%)] [N/A:N/A] Granulation A mount: [3:Red, Pink] [4:Red, Pink] [N/A:N/A] Granulation Quality: [3:Small (1-33%)] [4:Small (1-33%)] [N/A:N/A] Necrotic A mount: [3:Fat Layer (Subcutaneous Tissue): Yes Fat Layer (Subcutaneous Tissue): Yes N/A] Exposed Structures: [3:Fascia: No Tendon: No Muscle: No Joint: No Bone: No Small (1-33%)] [4:Fascia: No Tendon: No Muscle: No Joint: No Bone: No Small (1-33%)] [N/A:N/A] Treatment Notes Electronic Signature(s) Signed: 02/05/2021 4:59:32 PM By: Linton Ham MD Signed: 02/05/2021 5:00:21 PM By: Levan Hurst RN, BSN Entered By: Linton Ham on 02/05/2021 16:48:46 -------------------------------------------------------------------------------- Multi-Disciplinary Care Plan Details Patient Name: Date of Service: Pamela Fuller 02/05/2021 3:15 PM Medical Record Number: [id number] Patient Account  Number: [id number] Date of Birth/Sex: Treating RN: Feb 26, 1981 (40 y.o. Nancy Fetter Primary Care Tris Howell: Clovia Cuff Other Clinician: Referring Kipp Shank: Treating Shakira Los/Extender: Nida Boatman in Treatment: 1 Multidisciplinary Care Plan reviewed with physician Active Inactive Pressure Nursing Diagnoses: Knowledge deficit related to causes and risk factors for pressure ulcer development Knowledge deficit related to management of pressures ulcers Potential for impaired tissue integrity related to pressure, friction, moisture, and shear Goals: Patient/caregiver will verbalize risk factors for pressure ulcer development Date Initiated: 01/29/2021 Target Resolution Date: 03/09/2021 Goal Status: Active Patient/caregiver will verbalize understanding of pressure ulcer management Date Initiated: 01/29/2021 Target Resolution Date: 03/09/2021 Goal Status: Active Interventions: Assess: immobility, friction, shearing, incontinence upon admission and as needed Assess offloading mechanisms upon admission and as needed Assess potential for pressure ulcer upon admission and as needed Provide education on pressure ulcers Notes: Wound/Skin Impairment Nursing Diagnoses: Impaired tissue integrity Knowledge deficit related to ulceration/compromised skin integrity Goals: Patient/caregiver will verbalize understanding of skin care regimen Date Initiated: 01/29/2021 Target Resolution Date: 03/09/2021 Goal Status: Active Ulcer/skin breakdown will have a volume reduction of 30% by week 4 Date Initiated: 01/29/2021 Target Resolution Date: 03/09/2021 Goal Status: Active Interventions: Assess patient/caregiver ability to obtain necessary supplies Assess patient/caregiver ability to perform ulcer/skin care regimen upon admission and as needed Assess ulceration(s) every visit Provide education on ulcer and skin care Notes: Electronic Signature(s) Signed: 02/05/2021 5:00:21 PM  By: Levan Hurst RN, BSN Entered By: Levan Hurst on 02/05/2021 15:40:38 -------------------------------------------------------------------------------- Pain Assessment Details Patient Name: Date of Service: Pamela Fuller 02/05/2021 3:15 PM Medical Record Number: [id number] Patient Account Number: [id number] Date of Birth/Sex: Treating RN: Nov 11, 1980 (40 y.o. Debby Bud Primary Care Chailyn Racette: Clovia Cuff Other Clinician: Referring Yaiden Yang: Treating Murriel Holwerda/Extender: Nida Boatman in Treatment: 1 Active Problems Location of Pain Severity and Description of Pain Patient Has Paino No Site Locations Rate the pain. Rate the pain. Current Pain Level: 0 Pain Management and Medication Current Pain Management: Medication: No Cold Application: No Rest: No Massage: No Activity: No T.E.N.S.: No Heat Application: No Leg drop or elevation: No Is the Current Pain Management Adequate: Adequate How does your wound impact your activities of daily livingo Sleep: No Bathing: No Appetite: No Relationship With Others: No Bladder Continence: No Emotions: No Bowel Continence: No Work: No Toileting: No Drive: No Dressing: No Hobbies: No Electronic Signature(s) Signed: 02/05/2021 4:57:52 PM By: Deon Pilling Entered By: Deon Pilling on 02/05/2021 15:58:52 -------------------------------------------------------------------------------- Patient/Caregiver Education Details Patient Name: Date of Service: Pamela Fuller 4/4/2022andnbsp3:15 PM Medical Record Number: [id number] Patient Account Number: [id number] Date of Birth/Gender: Treating RN: 03-10-1981 (40 y.o. Nancy Fetter Primary  Care Physician: Clovia Cuff Other Clinician: Referring Physician: Treating Physician/Extender: Nida Boatman in Treatment: 1 Education Assessment Education Provided To: Patient Education Topics Provided Wound/Skin  Impairment: Methods: Explain/Verbal Responses: State content correctly Electronic Signature(s) Signed: 02/05/2021 5:00:21 PM By: Levan Hurst RN, BSN Entered By: Levan Hurst on 02/05/2021 15:40:52 -------------------------------------------------------------------------------- Wound Assessment Details Patient Name: Date of Service: Pamela Fuller 02/05/2021 3:15 PM Medical Record Number: [id number] Patient Account Number: [id number] Date of Birth/Sex: Treating RN: Jan 25, 1981 (40 y.o. Helene Shoe, Meta.Reding Primary Care Salle Brandle: Clovia Cuff Other Clinician: Referring Airi Copado: Treating Yvonne Petite/Extender: Nida Boatman in Treatment: 1 Wound Status Wound Number: 3 Primary Pressure Ulcer Etiology: Wound Location: Sacrum Wound Open Wounding Event: Gradually Appeared Status: Date Acquired: 11/01/2020 Comorbid Aspiration, Hypertension, Crohns, Rheumatoid Arthritis, Weeks Of Treatment: 1 History: Quadriplegia, Paraplegia Clustered Wound: No Photos Wound Measurements Length: (cm) 2 Width: (cm) 0.3 Depth: (cm) 0.1 Area: (cm) 0.471 Volume: (cm) 0.047 % Reduction in Area: 45.5% % Reduction in Volume: 45.3% Epithelialization: Small (1-33%) Tunneling: No Undermining: No Wound Description Classification: Category/Stage II Wound Margin: Distinct, outline attached Exudate Amount: Medium Exudate Type: Serosanguineous Exudate Color: red, brown Foul Odor After Cleansing: No Slough/Fibrino Yes Wound Bed Granulation Amount: Large (67-100%) Exposed Structure Granulation Quality: Red, Pink Fascia Exposed: No Necrotic Amount: Small (1-33%) Fat Layer (Subcutaneous Tissue) Exposed: Yes Necrotic Quality: Adherent Slough Tendon Exposed: No Muscle Exposed: No Joint Exposed: No Bone Exposed: No Treatment Notes Wound #3 (Sacrum) Cleanser Normal Saline Discharge Instruction: Cleanse the wound with Normal Saline or wound cleanser prior to applying a clean  dressing using gauze sponges, not tissue or cotton balls. Peri-Wound Care Skin Prep Discharge Instruction: Use skin prep as directed Topical Primary Dressing KerraCel Ag Gelling Fiber Dressing, 2x2 in (silver alginate) Discharge Instruction: Apply silver alginate to wound bed as instructed Secondary Dressing Woven Gauze Sponge, Non-Sterile 4x4 in Discharge Instruction: Apply over primary dressing as directed. ABD Pad, 5x9 Discharge Instruction: Apply over primary dressing as directed. Secured With 49M Medipore H Soft Cloth Surgical T 4 x 2 (in/yd) ape Discharge Instruction: Secure dressing with tape as directed. Compression Wrap Compression Stockings Add-Ons Electronic Signature(s) Signed: 02/06/2021 2:26:07 PM By: Sandre Kitty Signed: 02/07/2021 5:48:55 PM By: Deon Pilling Previous Signature: 02/05/2021 4:57:52 PM Version By: Deon Pilling Entered By: Sandre Kitty on 02/05/2021 17:02:37 -------------------------------------------------------------------------------- Wound Assessment Details Patient Name: Date of Service: Pamela Fuller 02/05/2021 3:15 PM Medical Record Number: [id number] Patient Account Number: [id number] Date of Birth/Sex: Treating RN: 09/11/1981 (40 y.o. Helene Shoe, Tammi Klippel Primary Care Yaniah Thiemann: Clovia Cuff Other Clinician: Referring Lavita Pontius: Treating Khristy Kalan/Extender: Nida Boatman in Treatment: 1 Wound Status Wound Number: 4 Primary Pressure Ulcer Etiology: Wound Location: Right Gluteus Wound Open Wounding Event: Gradually Appeared Status: Date Acquired: 11/03/2020 Comorbid Aspiration, Hypertension, Crohns, Rheumatoid Arthritis, Weeks Of Treatment: 1 History: Quadriplegia, Paraplegia Clustered Wound: No Photos Wound Measurements Length: (cm) 2 Width: (cm) 1.5 Depth: (cm) 1.3 Area: (cm) 2.356 Volume: (cm) 3.063 % Reduction in Area: 54.6% % Reduction in Volume: -491.3% Epithelialization: Small  (1-33%) Tunneling: No Undermining: Yes Starting Position (o'clock): 11 Ending Position (o'clock): 7 Maximum Distance: (cm) 1.5 Wound Description Classification: Category/Stage III Wound Margin: Distinct, outline attached Exudate Amount: Medium Exudate Type: Serosanguineous Exudate Color: red, brown Foul Odor After Cleansing: No Slough/Fibrino Yes Wound Bed Granulation Amount: Large (67-100%) Exposed Structure Granulation Quality: Red, Pink Fascia Exposed: No Necrotic Amount: Small (1-33%) Fat Layer (Subcutaneous Tissue) Exposed: Yes Necrotic Quality: Adherent  Slough Tendon Exposed: No Muscle Exposed: No Joint Exposed: No Bone Exposed: No Treatment Notes Wound #4 (Gluteus) Wound Laterality: Right Cleanser Normal Saline Discharge Instruction: Cleanse the wound with Normal Saline or wound cleanser prior to applying a clean dressing using gauze sponges, not tissue or cotton balls. Peri-Wound Care Skin Prep Discharge Instruction: Use skin prep as directed Topical Primary Dressing KerraCel Ag Gelling Fiber Dressing, 2x2 in (silver alginate) Discharge Instruction: Lightly pack into wounds Secondary Dressing Woven Gauze Sponge, Non-Sterile 4x4 in Discharge Instruction: Apply over primary dressing as directed. ABD Pad, 5x9 Discharge Instruction: Apply over primary dressing as directed. Secured With 41M Medipore H Soft Cloth Surgical T 4 x 2 (in/yd) ape Discharge Instruction: Secure dressing with tape as directed. Compression Wrap Compression Stockings Add-Ons Electronic Signature(s) Signed: 02/06/2021 2:26:07 PM By: Sandre Kitty Signed: 02/07/2021 5:48:55 PM By: Deon Pilling Previous Signature: 02/05/2021 4:57:52 PM Version By: Deon Pilling Entered By: Sandre Kitty on 02/05/2021 17:04:07 -------------------------------------------------------------------------------- Vitals Details Patient Name: Date of Service: Pamela Fuller 02/05/2021 3:15 PM Medical Record  Number: [id number] Patient Account Number: [id number] Date of Birth/Sex: Treating RN: Mar 07, 1981 (40 y.o. Helene Shoe, Meta.Reding Primary Care Mister Krahenbuhl: Clovia Cuff Other Clinician: Referring Laniesha Das: Treating Brexley Cutshaw/Extender: Nida Boatman in Treatment: 1 Vital Signs Time Taken: 15:46 Temperature (F): 97.8 Height (in): 61 Pulse (bpm): 80 Weight (lbs): 110 Respiratory Rate (breaths/min): 17 Body Mass Index (BMI): 20.8 Blood Pressure (mmHg): 110/75 Reference Range: 80 - 120 mg / dl Electronic Signature(s) Signed: 02/05/2021 4:57:52 PM By: Deon Pilling Entered By: Deon Pilling on 02/05/2021 15:58:44

## 2021-02-06 MED ORDER — GENTAMICIN SULFATE 40 MG/ML IJ SOLN
360.0000 mg | Freq: Once | INTRAVENOUS | Status: AC
  Administered 2021-02-07: 360 mg via INTRAVENOUS
  Filled 2021-02-06: qty 9

## 2021-02-07 ENCOUNTER — Ambulatory Visit (HOSPITAL_COMMUNITY): Payer: 59 | Admitting: Physician Assistant

## 2021-02-07 ENCOUNTER — Encounter (HOSPITAL_COMMUNITY)
Admission: RE | Disposition: A | Discharge status: Home / Self Care | Payer: Self-pay | Source: Home / Self Care | Attending: Urology

## 2021-02-07 ENCOUNTER — Ambulatory Visit (HOSPITAL_COMMUNITY)
Admission: RE | Admit: 2021-02-07 | Discharge: 2021-02-07 | Disposition: A | Discharge status: Home / Self Care | Payer: 59 | Attending: Urology | Admitting: Urology

## 2021-02-07 ENCOUNTER — Encounter (HOSPITAL_COMMUNITY): Payer: Self-pay | Admitting: Urology

## 2021-02-07 DIAGNOSIS — F209 Schizophrenia, unspecified: Secondary | ICD-10-CM | POA: Diagnosis not present

## 2021-02-07 DIAGNOSIS — F4310 Post-traumatic stress disorder, unspecified: Secondary | ICD-10-CM | POA: Insufficient documentation

## 2021-02-07 DIAGNOSIS — N3020 Other chronic cystitis without hematuria: Secondary | ICD-10-CM | POA: Insufficient documentation

## 2021-02-07 DIAGNOSIS — N319 Neuromuscular dysfunction of bladder, unspecified: Secondary | ICD-10-CM | POA: Diagnosis not present

## 2021-02-07 DIAGNOSIS — Z79899 Other long term (current) drug therapy: Secondary | ICD-10-CM | POA: Diagnosis not present

## 2021-02-07 DIAGNOSIS — Z20822 Contact with and (suspected) exposure to covid-19: Secondary | ICD-10-CM | POA: Insufficient documentation

## 2021-02-07 DIAGNOSIS — G808 Other cerebral palsy: Secondary | ICD-10-CM | POA: Diagnosis not present

## 2021-02-07 DIAGNOSIS — Z87440 Personal history of urinary (tract) infections: Secondary | ICD-10-CM | POA: Insufficient documentation

## 2021-02-07 DIAGNOSIS — N210 Calculus in bladder: Secondary | ICD-10-CM | POA: Diagnosis not present

## 2021-02-07 HISTORY — DX: Other injury of unspecified body region, subsequent encounter: T14.8XXD

## 2021-02-07 HISTORY — DX: Gastro-esophageal reflux disease without esophagitis: K21.9

## 2021-02-07 HISTORY — DX: Personal history of urinary calculi: Z87.442

## 2021-02-07 HISTORY — DX: Dependence on wheelchair: Z99.3

## 2021-02-07 HISTORY — DX: Presence of other specified devices: Z97.8

## 2021-02-07 HISTORY — DX: Colostomy status: Z93.3

## 2021-02-07 HISTORY — PX: CYSTOSCOPY WITH LITHOLAPAXY: SHX1425

## 2021-02-07 LAB — BASIC METABOLIC PANEL
Anion gap: 6 (ref 5–15)
BUN: 8 mg/dL (ref 6–20)
CO2: 25 mmol/L (ref 22–32)
Calcium: 8.6 mg/dL — ABNORMAL LOW (ref 8.9–10.3)
Chloride: 104 mmol/L (ref 98–111)
Creatinine, Ser: 0.37 mg/dL — ABNORMAL LOW (ref 0.44–1.00)
GFR, Estimated: >60 mL/min (ref >60)
Glucose, Bld: 96 mg/dL (ref 70–99)
Potassium: 4.2 mmol/L (ref 3.5–5.1)
Sodium: 135 mmol/L (ref 135–145)

## 2021-02-07 LAB — SARS CORONAVIRUS 2 BY RT PCR (HOSPITAL ORDER, PERFORMED IN ~~LOC~~ HOSPITAL LAB): SARS Coronavirus 2: NEGATIVE

## 2021-02-07 LAB — PREGNANCY, URINE: Preg Test, Ur: NEGATIVE

## 2021-02-07 LAB — CBC
HCT: 39.5 % (ref 36.0–46.0)
Hemoglobin: 12.2 g/dL (ref 12.0–15.0)
MCH: 27.2 pg (ref 26.0–34.0)
MCHC: 30.9 g/dL (ref 30.0–36.0)
MCV: 88.2 fL (ref 80.0–100.0)
Platelets: 279 10*3/uL (ref 150–400)
RBC: 4.48 MIL/uL (ref 3.87–5.11)
RDW: 16 % — ABNORMAL HIGH (ref 11.5–15.5)
WBC: 10.2 10*3/uL (ref 4.0–10.5)
nRBC: 0 % (ref 0.0–0.2)

## 2021-02-07 SURGERY — CYSTOSCOPY, WITH BLADDER CALCULUS LITHOLAPAXY
Anesthesia: General | Site: Bladder

## 2021-02-07 MED ORDER — LIDOCAINE 2% (20 MG/ML) 5 ML SYRINGE
INTRAMUSCULAR | Status: AC
  Filled 2021-02-07: qty 5

## 2021-02-07 MED ORDER — SODIUM CHLORIDE 0.9 % IR SOLN
Status: DC | PRN
  Administered 2021-02-07: 6000 mL

## 2021-02-07 MED ORDER — FENTANYL CITRATE (PF) 100 MCG/2ML IJ SOLN: 25.0000 ug | INTRAMUSCULAR | Status: DC | PRN

## 2021-02-07 MED ORDER — ONDANSETRON HCL 4 MG/2ML IJ SOLN
INTRAMUSCULAR | Status: AC
  Filled 2021-02-07: qty 2

## 2021-02-07 MED ORDER — DEXAMETHASONE SODIUM PHOSPHATE 10 MG/ML IJ SOLN
INTRAMUSCULAR | Status: DC | PRN
  Administered 2021-02-07: 7 mg via INTRAVENOUS

## 2021-02-07 MED ORDER — ONDANSETRON HCL 4 MG/2ML IJ SOLN: 4.0000 mg | Freq: Once | INTRAMUSCULAR | Status: DC | PRN

## 2021-02-07 MED ORDER — FENTANYL CITRATE (PF) 100 MCG/2ML IJ SOLN
INTRAMUSCULAR | Status: DC | PRN
  Administered 2021-02-07 (×3): 25 ug via INTRAVENOUS

## 2021-02-07 MED ORDER — PROPOFOL 10 MG/ML IV BOLUS
INTRAVENOUS | Status: DC | PRN
  Administered 2021-02-07: 150 mg via INTRAVENOUS

## 2021-02-07 MED ORDER — MIDAZOLAM HCL 2 MG/2ML IJ SOLN
INTRAMUSCULAR | Status: AC
  Filled 2021-02-07: qty 2

## 2021-02-07 MED ORDER — LACTATED RINGERS IV SOLN: INTRAVENOUS | Status: DC

## 2021-02-07 MED ORDER — FENTANYL CITRATE (PF) 100 MCG/2ML IJ SOLN
INTRAMUSCULAR | Status: AC
  Filled 2021-02-07: qty 2

## 2021-02-07 MED ORDER — DEXAMETHASONE SODIUM PHOSPHATE 10 MG/ML IJ SOLN
INTRAMUSCULAR | Status: AC
  Filled 2021-02-07: qty 1

## 2021-02-07 MED ORDER — ONDANSETRON HCL 4 MG/2ML IJ SOLN
INTRAMUSCULAR | Status: DC | PRN
  Administered 2021-02-07: 4 mg via INTRAVENOUS

## 2021-02-07 MED ORDER — MIDAZOLAM HCL 5 MG/5ML IJ SOLN
INTRAMUSCULAR | Status: DC | PRN
  Administered 2021-02-07 (×2): 1 mg via INTRAVENOUS

## 2021-02-07 MED ORDER — PROPOFOL 10 MG/ML IV BOLUS
INTRAVENOUS | Status: AC
  Filled 2021-02-07: qty 20

## 2021-02-07 MED ORDER — CHLORHEXIDINE GLUCONATE 0.12 % MT SOLN: 15.0000 mL | Freq: Once | OROMUCOSAL | Status: DC

## 2021-02-07 MED ORDER — ORAL CARE MOUTH RINSE: 15.0000 mL | Freq: Once | OROMUCOSAL | Status: DC

## 2021-02-07 MED ORDER — LIDOCAINE 2% (20 MG/ML) 5 ML SYRINGE
INTRAMUSCULAR | Status: DC | PRN
  Administered 2021-02-07: 60 mg via INTRAVENOUS

## 2021-02-07 SURGICAL SUPPLY — 16 items
BAG URINE DRAIN 2000ML AR STRL (UROLOGICAL SUPPLIES) ×2 IMPLANT
BAG URO CATCHER STRL LF (MISCELLANEOUS) ×2 IMPLANT
CATH SILICONE 5CC 18FR (INSTRUMENTS) ×2 IMPLANT
CLOTH BEACON ORANGE TIMEOUT ST (SAFETY) ×2 IMPLANT
GLOVE SURG ENC TEXT LTX SZ7.5 (GLOVE) ×2 IMPLANT
GOWN STRL REUS W/TWL XL LVL3 (GOWN DISPOSABLE) ×4 IMPLANT
KIT TURNOVER KIT A (KITS) ×2 IMPLANT
LASER FIB FLEXIVA PULSE ID 550 (Laser) ×2 IMPLANT
LASER FIB FLEXIVA PULSE ID 910 (Laser) IMPLANT
MANIFOLD NEPTUNE II (INSTRUMENTS) ×2 IMPLANT
PACK CYSTO (CUSTOM PROCEDURE TRAY) ×2 IMPLANT
PENCIL SMOKE EVACUATOR (MISCELLANEOUS) IMPLANT
SYR TOOMEY IRRIG 70ML (MISCELLANEOUS) ×2
SYRINGE TOOMEY IRRIG 70ML (MISCELLANEOUS) ×1 IMPLANT
TUBING CONNECTING 10 (TUBING) ×2 IMPLANT
TUBING UROLOGY SET (TUBING) ×2 IMPLANT

## 2021-02-07 NOTE — Anesthesia Postprocedure Evaluation (Signed)
Anesthesia Post Note  Patient: Clinical biochemist  Procedure(s) Performed: CYSTOSCOPY WITH LITHOLAPAXY (N/A Bladder)     Patient location during evaluation: PACU Anesthesia Type: General Level of consciousness: awake and alert Pain management: pain level controlled Vital Signs Assessment: post-procedure vital signs reviewed and stable Respiratory status: spontaneous breathing, nonlabored ventilation and respiratory function stable Cardiovascular status: blood pressure returned to baseline and stable Postop Assessment: no apparent nausea or vomiting Anesthetic complications: no   No complications documented.  Last Vitals:  Vitals:   02/07/21 1400 02/07/21 1409  BP: 111/60 113/67  Pulse: 85 88  Resp: 14 16  Temp: 36.8 C   SpO2: 97% 97%    Last Pain:  Vitals:   02/07/21 1409  TempSrc:   PainSc: 0-No pain                 Audry Pili

## 2021-02-07 NOTE — H&P (Signed)
Office Visit Report     01/15/2021   --------------------------------------------------------------------------------   Pamela Fuller  MRN: [id number]  DOB: 1981-03-18, 40 year old Female  SSN:    PRIMARY CARE:    REFERRING:  Pamela Gravel, NP  PROVIDER:  Ellison Hughs, M.D.  LOCATION:  [facility] [phone]     --------------------------------------------------------------------------------   CC/HPI: Leaking urine around SPT   [name] is a 40 year old female with a history of NGB managed with a SPT, chronic cystitis, quadriplegia, cerebral palsy, stage IV decubitus ulcer, schizophrenia, and PTSD.   01/15/21: Pt with chronic issues with leakage around SPT for the past several months. She has missed multiple OVs over the past several months. CT from August revealed multiple bladder stones, the largest of which measuring 1.6 cm in greatest dimension. CT also noted significant stool burden. Aside from mild suprapubic pressure, the patient denies any significant symptoms such as fever/chills, flank pain or gross hematuria.     ALLERGIES: Sulfur    MEDICATIONS: Baclofen 10 mg tablet  Carvedilol 6.25 mg tablet  Green Tea 250 mg capsule  Krill Oil 1,000 mg-130 mg (40 mg-80 mg)-20 mg-5 mg-1,500 mcg capsule  Melatonin + L-Theanine 3 mg capsule  Probiotic  Risperidone 0.25 mg tablet  Sertraline Hcl 50 mg tablet  Sertraline Hcl 100 mg tablet     Notes: home health aid will call in to advise medication. 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, - 10/06/2019 Chronic cystitis (w/o hematuria) - 10/06/2019 Suprapubic pain      PMH Notes: Problem List Collapse by Default  Collapse by  Default    Cardiovascular and Mediastinum  Benign essential hypertension  Nervous and Auditory  Cerebral palsy ([place])  Musculoskeletal and Integument  Sacral decubitus ulcer, stage IV (HCC)  Chronic suprapubic catheter (HCC)  Functional quadriplegia (HCC)  Iron deficiency anemia  PTSD (post-traumatic stress disorder)  Schizoaffective disorder (HCC)      NON-GU PMH: Central auditory processing disorder Cerebral palsy, unspecified Encounter for attention to colostomy Hypertension Mediastinitis Personal history of (corrected) congenital malformations of integument, limbs and musculoskeletal system Pressure ulcer of sacral region, stage 4    FAMILY HISTORY: None   SOCIAL HISTORY: Marital Status: Divorced Preferred Language: English; Race: White Current Smoking Status: Patient has never smoked.   Tobacco Use Assessment Completed: Used Tobacco in last 30 days? Social Drinker.  Drinks 1 caffeinated drink per day. Has had a blood transfusion.    REVIEW OF SYSTEMS:    GU Review Female:   Patient denies frequent urination, hard to postpone urination, burning /pain with urination, get up at night to urinate, leakage of urine, stream starts and stops, trouble starting your stream, have to strain to urinate, and being pregnant.  Gastrointestinal (Upper):   Patient denies nausea, vomiting, and indigestion/ heartburn.  Gastrointestinal (Lower):   Patient denies diarrhea and constipation.  Constitutional:   Patient denies fever, night sweats, weight loss, and fatigue.  Skin:   Patient denies skin rash/ lesion and itching.  Eyes:   Patient denies blurred vision and double vision.  Ears/ Nose/ Throat:   Patient denies sore throat and sinus problems.  Hematologic/Lymphatic:   Patient denies swollen glands and easy  bruising.  Cardiovascular:   Patient denies leg swelling and chest pains.  Respiratory:   Patient denies shortness of breath and cough.  Endocrine:   Patient denies excessive  thirst.  Musculoskeletal:   Patient denies back pain and joint pain.  Neurological:   Patient denies headaches and dizziness.  Psychologic:   Patient denies depression and anxiety.   VITAL SIGNS:      01/15/2021 09:19 AM  BP 94/66 mmHg  Pulse 84 /min  Temperature 97.8 F / 36.5 C   MULTI-SYSTEM PHYSICAL EXAMINATION:    Constitutional: Thin. Severe physical deformities. Normally developed. Good grooming.   Respiratory: No labored breathing, no use of accessory muscles.   Neurologic / Psychiatric: Oriented to time, oriented to place, oriented to person. No depression, no anxiety, no agitation.   Gastrointestinal: Suprapubic tube in place with cloudy urine output. Colostomy is viable with small amount of stool output.  Musculoskeletal: Use of power chair, atrophy of all extremities.      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 Risk include, but are not limited to, bleeding, urinary tract infection, scarring within the suprapubic tube tract, bladder injury, bladder perforation, MI, CVA, PE and the inherent risk of general anesthesia. She voices understanding wishes to proceed.

## 2021-02-07 NOTE — Anesthesia Procedure Notes (Signed)
Procedure Name: LMA Insertion Date/Time: 02/07/2021 12:35 PM Performed by: Sharlette Dense, CRNA Patient Re-evaluated:Patient Re-evaluated prior to induction Oxygen Delivery Method: Circle system utilized Preoxygenation: Pre-oxygenation with 100% oxygen Induction Type: IV induction LMA: LMA inserted LMA Size: 4.0 Number of attempts: 1 Placement Confirmation: positive ETCO2 and breath sounds checked- equal and bilateral Dental Injury: Teeth and Oropharynx as per pre-operative assessment

## 2021-02-07 NOTE — Transfer of Care (Signed)
Immediate Anesthesia Transfer of Care Note  Patient: Pamela Fuller  Procedure(s) Performed: CYSTOSCOPY WITH LITHOLAPAXY (N/A Bladder)  Patient Location: PACU  Anesthesia Type:General  Level of Consciousness: awake and patient cooperative  Airway & Oxygen Therapy: Patient Spontanous Breathing and Patient connected to nasal cannula oxygen  Post-op Assessment: Report given to RN and Post -op Vital signs reviewed and stable  Post vital signs: Reviewed and stable  Last Vitals:  Vitals Value Taken Time  BP 121/84 02/07/21 1317  Temp    Pulse 85 02/07/21 1319  Resp 14 02/07/21 1319  SpO2 98 % 02/07/21 1319  Vitals shown include unvalidated device data.  Last Pain:  Vitals:   02/07/21 1103  TempSrc:   PainSc: 10-Worst pain ever         Complications: No complications documented.

## 2021-02-07 NOTE — Anesthesia Preprocedure Evaluation (Addendum)
Anesthesia Evaluation  Patient identified by MRN, date of birth, ID band Patient awake    Reviewed: Allergy & Precautions, NPO status , Patient's Chart, lab work & pertinent test results, reviewed documented beta blocker date and time   History of Anesthesia Complications Negative for: history of anesthetic complications  Airway Mallampati: II  TM Distance: >3 FB Neck ROM: Full    Dental  (+) Dental Advisory Given, Poor Dentition   Pulmonary neg pulmonary ROS,    Pulmonary exam normal        Cardiovascular hypertension, Pt. on medications and Pt. on home beta blockers Normal cardiovascular exam     Neuro/Psych PSYCHIATRIC DISORDERS Anxiety  Schizoaffective d/o  Cerebral palsy, wheelchair bound   Neuromuscular disease    GI/Hepatic Neg liver ROS, GERD  Controlled, Colostomy IBS/Celiac disease     Endo/Other  negative endocrine ROS  Renal/GU negative Renal ROS Bladder dysfunction   Chronic foley     Musculoskeletal  Scoliosis    Abdominal   Peds  Hematology negative hematology ROS (+)   Anesthesia Other Findings Decubitus ulcers Covid test negative   Reproductive/Obstetrics                            Anesthesia Physical Anesthesia Plan  ASA: III  Anesthesia Plan: General   Post-op Pain Management:    Induction: Intravenous  PONV Risk Score and Plan: 3 and Treatment may vary due to age or medical condition, Ondansetron and Dexamethasone  Airway Management Planned: LMA  Additional Equipment: None  Intra-op Plan:   Post-operative Plan: Extubation in OR  Informed Consent: I have reviewed the patients History and Physical, chart, labs and discussed the procedure including the risks, benefits and alternatives for the proposed anesthesia with the patient or authorized representative who has indicated his/her understanding and acceptance.     Dental advisory  given  Plan Discussed with: CRNA and Anesthesiologist  Anesthesia Plan Comments:        Anesthesia Quick Evaluation

## 2021-02-07 NOTE — Op Note (Signed)
Operative Note  Preoperative diagnosis:  1.  Multiple bladder stones 2.  Recurrent UTIs  Postoperative diagnosis: 1.  Multiple bladder stones measuring approximately 1 cm 2.  Recurrent UTIs  Procedure(s): 1.  Cystoscopy with cystolitholapaxy of three, 1 cm bladder stone  Surgeon: Ellison Hughs, MD  Assistants:  None  Anesthesia:  General  Complications:  None  EBL: Less than 5 mL  Specimens: 1.  Bladder stones  Drains/Catheters: 1.  18 French latex free suprapubic tube with 10 mL of sterile water in the balloon  Intraoperative findings:   1. Three 1 cm bladder stones  Indication:  Pamela Fuller is a 40 y.o. female with a history of cerebral palsy with quadriplegia and a neurogenic bladder requiring an indwelling suprapubic tube.  The patient has been dealing with recurrent urinary tract infections over the past several months and was found to have numerous bladder stones on recent imaging.  She has been consented for the above procedures, voices understanding and wishes to proceed.  Description of procedure:  After informed consent was obtained, the patient was brought to the operating room and general LMA anesthesia was administered. The patient was then placed in the dorsolithotomy position and prepped and draped in the usual sterile fashion. A timeout was performed. A 23 French rigid cystoscope was then inserted into the urethral meatus and advanced into the bladder under direct vision. A complete bladder survey revealed a total of three 1 cm bladder stones.  A 550 W holmium laser was then used to fracture the stones into numerous smaller pieces.  The pieces of the stones were then siphoned out of the bladder through the sheath of the cystoscope.  Reinspection of the bladder revealed no evidence of trauma, hemorrhage or residual stone burden.  Her 77 French suprapubic tube was then exchanged atraumatically, confirming placement via cystoscopy.  Her suprapubic tube was  then placed to gravity drainage.  She tolerated the procedure well and was transferred to the postanesthesia in stable condition.  Plan: Follow-up in 1 month for a postoperative checkup and suprapubic tube exchange
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
## 2021-02-08 ENCOUNTER — Encounter (HOSPITAL_COMMUNITY): Payer: Self-pay | Admitting: Urology

## 2021-02-08 LAB — AEROBIC CULTURE W GRAM STAIN (SUPERFICIAL SPECIMEN)

## 2021-02-12 ENCOUNTER — Encounter (HOSPITAL_BASED_OUTPATIENT_CLINIC_OR_DEPARTMENT_OTHER): Payer: 59 | Admitting: Internal Medicine

## 2021-04-04 ENCOUNTER — Emergency Department (HOSPITAL_COMMUNITY): Payer: 59

## 2021-04-04 ENCOUNTER — Encounter (HOSPITAL_COMMUNITY): Payer: Self-pay

## 2021-04-04 ENCOUNTER — Emergency Department (HOSPITAL_COMMUNITY)
Admission: EM | Admit: 2021-04-04 | Discharge: 2021-04-05 | Disposition: A | Discharge status: Home / Self Care | Payer: 59 | Attending: Emergency Medicine | Admitting: Emergency Medicine

## 2021-04-04 DIAGNOSIS — K219 Gastro-esophageal reflux disease without esophagitis: Secondary | ICD-10-CM | POA: Insufficient documentation

## 2021-04-04 DIAGNOSIS — B9689 Other specified bacterial agents as the cause of diseases classified elsewhere: Secondary | ICD-10-CM | POA: Insufficient documentation

## 2021-04-04 DIAGNOSIS — L89159 Pressure ulcer of sacral region, unspecified stage: Secondary | ICD-10-CM

## 2021-04-04 DIAGNOSIS — I10 Essential (primary) hypertension: Secondary | ICD-10-CM | POA: Diagnosis not present

## 2021-04-04 DIAGNOSIS — K5900 Constipation, unspecified: Secondary | ICD-10-CM | POA: Diagnosis not present

## 2021-04-04 DIAGNOSIS — Z79899 Other long term (current) drug therapy: Secondary | ICD-10-CM | POA: Diagnosis not present

## 2021-04-04 DIAGNOSIS — N390 Urinary tract infection, site not specified: Secondary | ICD-10-CM | POA: Diagnosis not present

## 2021-04-04 DIAGNOSIS — R1030 Lower abdominal pain, unspecified: Secondary | ICD-10-CM | POA: Diagnosis present

## 2021-04-04 DIAGNOSIS — N200 Calculus of kidney: Secondary | ICD-10-CM | POA: Diagnosis not present

## 2021-04-04 LAB — CBC WITH DIFFERENTIAL/PLATELET
Abs Immature Granulocytes: 0.05 10*3/uL (ref 0.00–0.07)
Basophils Absolute: 0.1 10*3/uL (ref 0.0–0.1)
Basophils Relative: 1 %
Eosinophils Absolute: 0.3 10*3/uL (ref 0.0–0.5)
Eosinophils Relative: 3 %
HCT: 37 % (ref 36.0–46.0)
Hemoglobin: 11.6 g/dL — ABNORMAL LOW (ref 12.0–15.0)
Immature Granulocytes: 0 %
Lymphocytes Relative: 15 %
Lymphs Abs: 1.9 10*3/uL (ref 0.7–4.0)
MCH: 26.2 pg (ref 26.0–34.0)
MCHC: 31.4 g/dL (ref 30.0–36.0)
MCV: 83.5 fL (ref 80.0–100.0)
Monocytes Absolute: 0.7 10*3/uL (ref 0.1–1.0)
Monocytes Relative: 6 %
Neutro Abs: 9.3 10*3/uL — ABNORMAL HIGH (ref 1.7–7.7)
Neutrophils Relative %: 75 %
Platelets: 396 10*3/uL (ref 150–400)
RBC: 4.43 MIL/uL (ref 3.87–5.11)
RDW: 16.1 % — ABNORMAL HIGH (ref 11.5–15.5)
WBC: 12.4 10*3/uL — ABNORMAL HIGH (ref 4.0–10.5)
nRBC: 0 % (ref 0.0–0.2)

## 2021-04-04 LAB — URINALYSIS, ROUTINE W REFLEX MICROSCOPIC
Bilirubin Urine: NEGATIVE
Glucose, UA: NEGATIVE mg/dL
Ketones, ur: NEGATIVE mg/dL
Nitrite: POSITIVE — AB
Protein, ur: 100 mg/dL — AB
RBC / HPF: >50 RBC/hpf — ABNORMAL HIGH (ref 0–5)
Specific Gravity, Urine: 1.013 (ref 1.005–1.030)
WBC, UA: >50 WBC/hpf — ABNORMAL HIGH (ref 0–5)
pH: 7 (ref 5.0–8.0)

## 2021-04-04 LAB — BASIC METABOLIC PANEL
Anion gap: 9 (ref 5–15)
BUN: 10 mg/dL (ref 6–20)
CO2: 26 mmol/L (ref 22–32)
Calcium: 9.6 mg/dL (ref 8.9–10.3)
Chloride: 101 mmol/L (ref 98–111)
Creatinine, Ser: 0.63 mg/dL (ref 0.44–1.00)
GFR, Estimated: >60 mL/min (ref >60)
Glucose, Bld: 100 mg/dL — ABNORMAL HIGH (ref 70–99)
Potassium: 3.8 mmol/L (ref 3.5–5.1)
Sodium: 136 mmol/L (ref 135–145)

## 2021-04-04 MED ORDER — OXYCODONE-ACETAMINOPHEN 5-325 MG PO TABS: 1.0000 | ORAL_TABLET | Freq: Four times a day (QID) | ORAL | 0 refills | Status: AC | PRN

## 2021-04-04 MED ORDER — SODIUM CHLORIDE 0.9 % IV BOLUS
500.0000 mL | Freq: Once | INTRAVENOUS | Status: AC
  Administered 2021-04-04: 500 mL via INTRAVENOUS

## 2021-04-04 MED ORDER — SODIUM CHLORIDE 0.9 % IV SOLN
1.0000 g | Freq: Once | INTRAVENOUS | Status: AC
  Administered 2021-04-04: 1 g via INTRAVENOUS
  Filled 2021-04-04: qty 10

## 2021-04-04 MED ORDER — MORPHINE SULFATE (PF) 4 MG/ML IV SOLN
4.0000 mg | Freq: Once | INTRAVENOUS | Status: AC
  Administered 2021-04-04: 4 mg via INTRAVENOUS
  Filled 2021-04-04: qty 1

## 2021-04-04 MED ORDER — CEPHALEXIN 500 MG PO CAPS: 500.0000 mg | ORAL_CAPSULE | Freq: Four times a day (QID) | ORAL | 0 refills | Status: AC

## 2021-04-04 NOTE — ED Provider Notes (Signed)
[facility] DEPT Provider Note   CSN: [id number] Arrival date & time: 04/04/21  1726     History Chief Complaint  Patient presents with  . Abdominal Pain    Pamela Fuller is a 40 y.o. female.  She has a history of cerebral palsy, colostomy, suprapubic catheter.  She is wheelchair-bound.  She is coming here with lower abdominal pain starting yesterday along with concerns for possible UTI.  She is not sure the catheter is functioning correctly.  She has had this problem before.  Denies any fever.  No nausea vomiting.  She is usually on tramadol for pain and she says that she needs something more than that for her pain right now.  She recently had cystoscopy with Dr. Lovena Neighbours and they broke up 3 bladder stones.  The history is provided by the patient.  Abdominal Pain Pain location:  Suprapubic Pain quality: aching   Pain radiates to:  Back Pain severity:  Moderate Onset quality:  Gradual Duration:  2 hours Timing:  Constant Progression:  Unchanged Chronicity:  Recurrent Context: not trauma   Relieved by:  Nothing Worsened by:  Nothing Ineffective treatments:  None tried Associated symptoms: hematuria   Associated symptoms: no chest pain, no cough, no fever, no shortness of breath, no sore throat and no vomiting        Past Medical History:  Diagnosis Date  . Cerebral palsy ([place])   . Colostomy in place [facility])   . Foley catheter in place   . GERD (gastroesophageal reflux disease)   . History of kidney stones   . Hypertension   . Schizoaffective disorder ([place])   . Wheelchair bound   . Wound healing, delayed    Right hip, followed by Wound Care    Patient Active Problem List   Diagnosis Date Noted  . Delusions ([place])   . Acute pain of right knee 11/11/2019  . Closed fracture of distal end of femur with routine healing, subsequent encounter 11/11/2019  . Iron deficiency anemia 09/01/2019  . Benign essential hypertension 08/31/2019  .  Cerebral palsy ([place]) 08/31/2019  . 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 PTSD (post-traumatic stress disorder) 08/31/2019  . Sacral decubitus ulcer, stage IV ([place]) 08/31/2019  . Schizoaffective disorder ([place]) 08/31/2019  . Sepsis due to urinary tract infection ([place]) 07/18/2018  . Pain management contract signed 07/17/2018  . Celiac disease 07/03/2018  . IBS (irritable bowel syndrome) 07/03/2018  . Bladder stones 02/13/2018  . Flank pain 02/13/2018  . Acute cystitis with hematuria 10/31/2017  . Fecal impaction of colon ([place]) 03/24/2017  . Left lower quadrant pain 03/24/2017  . Severe protein-calorie malnutrition ([place]) 09/24/2016  . Acute constipation 08/20/2016  . Long term (current) use of antibiotics 07/26/2016  . Breakdown (mechanical) of cystostomy catheter, initial encounter ([place]) 06/09/2016  . Chronic left-sided low back pain with sciatica 08/08/2015  . Disorder of urethral catheter ([place]) 06/28/2015  . Generalized abdominal pain 04/20/2015  . Non-intractable vomiting with nausea 04/20/2015  . Urinary tract infection associated with catheterization of urinary tract, initial encounter ([place]) 03/20/2015  . Decubitus ulcer of lower back 02/13/2015  . Bowel incontinence 01/16/2015  . Chronic constipation 01/16/2015  . 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 Chronic sciatica 05/19/2014  . Calculus of kidney 08/16/2013  . History of recurrent UTIs 08/16/2013  . Neurogenic bladder 08/16/2013  . Urine retention 05/13/2013  . Scoliosis 07/16/2012    Past Surgical History:  Procedure Laterality  Date  . COLOSTOMY    . CYSTOSCOPY WITH LITHOLAPAXY N/A 02/07/2021   Procedure: CYSTOSCOPY WITH LITHOLAPAXY;  Surgeon: Ceasar Mons, MD;  Location: [facility]S;  Service: Urology;  Laterality: N/A;  ONLY NEEDS 60 MIN     OB History   No obstetric history on file.     No family history on file.  Social History   Tobacco  Use  . Smoking status: Never Smoker  . Smokeless tobacco: Never Used  Vaping Use  . Vaping Use: Never used  Substance Use Topics  . Alcohol use: Never  . Drug use: Never    Home Medications Prior to Admission medications   Medication Sig Start Date End Date Taking? Authorizing Provider  acetaminophen (TYLENOL) 500 MG tablet Take 1,000 mg by mouth every 8 (eight) hours as needed for moderate pain.    [provider]  baclofen (LIORESAL) 10 MG tablet Take 1.5 tablets (15 mg total) by mouth 3 (three) times daily. 10/03/19   Robinson, [place] N, PA-C  carvedilol (COREG) 6.25 MG tablet Take 6.25 mg by mouth 2 (two) times daily with a meal.    [provider]  Ensure (ENSURE) Take 237 mLs by mouth See admin instructions. Drink 2-3 shakes per day    [provider]  gabapentin (NEURONTIN) 300 MG capsule Take 300 mg by mouth 2 (two) times daily.    [provider]  Nyoka Cowden Tea 250 MG CAPS Take 250 mg by mouth in the morning and at bedtime.    [provider]  melatonin 5 MG TABS Take 5 mg by mouth at bedtime.    [provider]  Multiple Vitamin (MULTIVITAMIN WITH MINERALS) TABS tablet Take 1 tablet by mouth daily.    [provider]  risperiDONE (RISPERDAL) 0.25 MG tablet Take 0.5 mg by mouth 2 (two) times daily.    [provider]  sertraline (ZOLOFT) 100 MG tablet Take 100 mg by mouth in the morning.    [provider]  sertraline (ZOLOFT) 50 MG tablet Take 50 mg by mouth at bedtime.    [provider]  traMADol (ULTRAM) 50 MG tablet Take 50 mg by mouth every 8 (eight) hours.    [provider]  traZODone (DESYREL) 50 MG tablet Take 50 mg by mouth at bedtime.    [provider]    Allergies    Levaquin [levofloxacin], Codeine, Docusate, Quinolones, and Sulfa antibiotics  Review of Systems   Review of Systems  Constitutional: Negative for fever.  HENT: Negative for sore throat.    Eyes: Negative for visual disturbance.  Respiratory: Negative for cough and shortness of breath.   Cardiovascular: Negative for chest pain.  Gastrointestinal: Positive for abdominal pain. Negative for vomiting.  Genitourinary: Positive for hematuria.  Musculoskeletal: Positive for gait problem.  Skin: Positive for wound (sacral). Negative for rash.  Neurological: Negative for headaches.    Physical Exam Updated Vital Signs BP 101/89   Pulse 78   Temp 98.2 F (36.8 C) (Oral)   Resp 16   Ht 5' 7"  (1.702 m)   Wt 71.7 kg   SpO2 99%   BMI 24.75 kg/m   Physical Exam Vitals and nursing note reviewed.  Constitutional:      General: She is not in acute distress. HENT:     Head: Normocephalic and atraumatic.  Eyes:     Conjunctiva/sclera: Conjunctivae normal.  Cardiovascular:     Rate and Rhythm: Normal rate and regular rhythm.  Heart sounds: No murmur heard.   Pulmonary:     Effort: Pulmonary effort is normal. No respiratory distress.     Breath sounds: Normal breath sounds. No stridor. No wheezing.  Abdominal:     Palpations: Abdomen is soft.     Tenderness: There is abdominal tenderness in the suprapubic area.     Comments: She is a colostomy in place midline and a suprapubic catheter in place.  There is a little bit of macerated tissue around her suprapubic catheter.  She seems to be most tender where her catheter is.  Catheter does seem to be draining some cloudy urine with some flecks of blood.  Musculoskeletal:        General: No tenderness.     Cervical back: Neck supple.  Skin:    General: Skin is warm and dry.  Neurological:     Mental Status: She is alert.     GCS: GCS eye subscore is 4. GCS verbal subscore is 5. GCS motor subscore is 6.     Comments: She has contractures of her upper extremities and fairly flaccid lower extremities.  She is awake and alert     ED Results / Procedures / Treatments   Labs (all labs ordered are listed, but only abnormal  results are displayed) Labs Reviewed  BASIC METABOLIC PANEL - Abnormal; Notable for the following components:      Result Value   Glucose, Bld 100 (*)    All other components within normal limits  CBC WITH DIFFERENTIAL/PLATELET - Abnormal; Notable for the following components:   WBC 12.4 (*)    Hemoglobin 11.6 (*)    RDW 16.1 (*)    Neutro Abs 9.3 (*)    All other components within normal limits  URINALYSIS, ROUTINE W REFLEX MICROSCOPIC - Abnormal; Notable for the following components:   APPearance CLOUDY (*)    Hgb urine dipstick LARGE (*)    Protein, ur 100 (*)    Nitrite POSITIVE (*)    Leukocytes,Ua LARGE (*)    RBC / HPF >50 (*)    WBC, UA >50 (*)    Bacteria, UA MANY (*)    All other components within normal limits  URINE CULTURE    EKG None  Radiology CT Renal Stone Study  Result Date: 04/04/2021 CLINICAL DATA:  40 year old female with kidney stone. EXAM: CT ABDOMEN AND PELVIS WITHOUT CONTRAST TECHNIQUE: Multidetector CT imaging of the abdomen and pelvis was performed following the standard protocol without IV contrast. COMPARISON:  CT abdomen pelvis dated 06/26/2020. FINDINGS: Evaluation of this exam is limited in the absence of intravenous contrast. Evaluation is also limited due to streak artifact caused by patient's spinal hardware. Lower chest: The visualized lung bases are clear. No intra-abdominal free air or free fluid. Hepatobiliary: The liver is unremarkable. No intrahepatic biliary dilatation. The gallbladder is unremarkable. Pancreas: Unremarkable. No pancreatic ductal dilatation or surrounding inflammatory changes. Spleen: Normal in size without focal abnormality. Adrenals/Urinary Tract: The adrenal glands unremarkable. Linear staghorn calculus in the right kidney new since the prior CT. The stone extends into the upper, and middle pole collecting system and measures greater than 2 cm in length. No hydronephrosis. The left kidney is unremarkable. The visualized  ureters are unremarkable. The urinary bladder is decompressed around a suprapubic catheter. Stomach/Bowel: There is moderate stool throughout the colon. There is left lower quadrant loop colostomy. There is no bowel obstruction or active inflammation. The appendix is normal. Vascular/Lymphatic: Mild atherosclerotic calcification of the abdominal  aorta. The IVC is unremarkable. No portal venous gas. There is no adenopathy. Reproductive: The uterus is enlarged, likely myomatous. No adnexal masses identified. Other: None Musculoskeletal: Osteopenia and scoliosis and posterior spinal fusion hardware. No acute osseous pathology. Deep sacral decubitus ulcer extending to the level of the distal sacrum. No fluid collection. IMPRESSION: 1. 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 No hydronephrosis. 2. Moderate colonic stool burden. No bowel obstruction. Normal appendix. 3. Deep sacral decubitus ulcer extending to the level of the distal sacrum. 4. Aortic Atherosclerosis (ICD10-I70.0). Electronically Signed   By: Anner Crete M.D.   On: 04/04/2021 21:15    Procedures Procedures   Medications Ordered in ED Medications  morphine 4 MG/ML injection 4 mg (4 mg Intravenous Given 04/04/21 2009)  sodium chloride 0.9 % bolus 500 mL (0 mLs Intravenous Stopped 04/04/21 2124)  cefTRIAXone (ROCEPHIN) 1 g in sodium chloride 0.9 % 100 mL IVPB (0 g Intravenous Stopped 04/04/21 2238)    ED Course  I have reviewed the triage vital signs and the nursing notes.  Pertinent labs & imaging results that were available during my care of the patient were reviewed by me and considered in my medical decision making (see chart for details).  Clinical Course as of 04/05/21 0959  Wed Apr 04, 2021  2135 Reviewed results of imaging labs urinalysis with patient and her caregiver.  We will give a dose of IV antibiotics here.  As far as the staghorn calculus recommend close follow-up with her urologist.  Catheter seems to  be functioning appropriately.  We also talked about increasing her bowel regimen.  They are comfortable plan for discharge. [MB]    Clinical Course User Index [MB] Hayden Rasmussen, MD   MDM Rules/Calculators/A&P                         This patient complains of low abdominal pain urinary symptoms.  Concern for blocked Foley.; this involves an extensive number of treatment Options and is a complaint that carries with it a high risk of complications and Morbidity. The differential includes UTI, malposition Foley, constipation, obstruction, metabolic derangement  I ordered, reviewed and interpreted labs, which included CBC with mildly elevated white count, hemoglobin stable from priors, chemistries normal, urinalysis consistent with infection nitrite positive greater than 50 whites I ordered medication IV fluids, IV antibiotics, oral pain medication with improvement in her symptoms I ordered imaging studies which included CT renal and I independently    visualized and interpreted imaging which showed appropriately placed catheter, new staghorn calculus right, moderate stool burden Additional history obtained from patient's caregiver Previous records obtained and reviewed in epic, recent bladder calculus surgery  After the interventions stated above, I reevaluated the patient and found patient symptoms to be moderately improved.  Blood pressures remain soft although similar to priors.  Urine is adequately flowing through Foley.  Do not feel needs to be replaced.  We will start on IV and oral antibiotics and follow-up with cultures.  Return instructions discussed.   Final Clinical Impression(s) / ED Diagnoses Final diagnoses:  Lower urinary tract infectious disease  Staghorn calculus  Constipation, unspecified constipation type  Pressure injury of skin of sacral region, unspecified injury stage    Rx / DC Orders ED Discharge Orders         Ordered    oxyCODONE-acetaminophen  (PERCOCET/ROXICET) 5-325 MG tablet  Every 6 hours PRN  04/04/21 2142    cephALEXin (KEFLEX) 500 MG capsule  4 times daily        04/04/21 2142           Hayden Rasmussen, MD 04/05/21 1003

## 2021-04-04 NOTE — Discharge Instructions (Signed)
You were seen in the emergency department for abdominal pain and urinary symptoms.  Your urinalysis showed signs of infection.  Your catheter seems to be functioning appropriately.  Your CAT scan showed signs of constipation.  We are putting you on some pain medicine and antibiotics.  The pain medicine may make constipation worse so you will need to increase your bowel regiment.  You also showed signs of a staghorn kidney stone in your right kidney.  This will need close follow-up with urology.

## 2021-04-04 NOTE — ED Notes (Signed)
[facility] contacted and transport arranged for patient.

## 2021-04-04 NOTE — ED Triage Notes (Signed)
[facility] brings pt in from home for lower abdominal pain. Home health nurse had concern for UTI after seeing a blood clot in urine today.

## 2021-04-05 DIAGNOSIS — N390 Urinary tract infection, site not specified: Secondary | ICD-10-CM | POA: Diagnosis not present

## 2021-04-05 MED ORDER — MELATONIN 5 MG PO TABS
5.0000 mg | ORAL_TABLET | Freq: Every day | ORAL | Status: DC
  Administered 2021-04-05: 5 mg via ORAL
  Filled 2021-04-05: qty 1

## 2021-04-05 MED ORDER — SERTRALINE HCL 50 MG PO TABS: 100.0000 mg | ORAL_TABLET | Freq: Every morning | ORAL | Status: DC

## 2021-04-05 MED ORDER — RISPERIDONE 0.5 MG PO TABS
0.5000 mg | ORAL_TABLET | Freq: Two times a day (BID) | ORAL | Status: DC
  Administered 2021-04-05: 0.5 mg via ORAL
  Filled 2021-04-05: qty 1

## 2021-04-05 MED ORDER — CARVEDILOL 3.125 MG PO TABS: 6.2500 mg | ORAL_TABLET | Freq: Two times a day (BID) | ORAL | Status: DC

## 2021-04-05 MED ORDER — BACLOFEN 10 MG PO TABS: 15.0000 mg | ORAL_TABLET | Freq: Three times a day (TID) | ORAL | Status: DC

## 2021-04-05 MED ORDER — TRAZODONE HCL 100 MG PO TABS
50.0000 mg | ORAL_TABLET | Freq: Every day | ORAL | Status: DC
  Administered 2021-04-05: 50 mg via ORAL
  Filled 2021-04-05: qty 1

## 2021-04-05 MED ORDER — GABAPENTIN 300 MG PO CAPS
300.0000 mg | ORAL_CAPSULE | Freq: Two times a day (BID) | ORAL | Status: DC
  Administered 2021-04-05: 300 mg via ORAL
  Filled 2021-04-05: qty 1

## 2021-04-06 LAB — URINE CULTURE

## 2023-02-17 ENCOUNTER — Encounter: Payer: Self-pay | Admitting: *Deleted
